# Patient Record
Sex: Female | Born: 1945 | Hispanic: No | Smoking: Former smoker
Health system: Southern US, Community
[De-identification: ages and names within clinical notes are randomized; demographics above are authoritative.]

## PROBLEM LIST (undated history)

## (undated) DIAGNOSIS — M1990 Unspecified osteoarthritis, unspecified site: Secondary | ICD-10-CM

## (undated) DIAGNOSIS — N819 Female genital prolapse, unspecified: Secondary | ICD-10-CM

## (undated) DIAGNOSIS — E7800 Pure hypercholesterolemia, unspecified: Secondary | ICD-10-CM

## (undated) DIAGNOSIS — R011 Cardiac murmur, unspecified: Secondary | ICD-10-CM

## (undated) HISTORY — DX: Pure hypercholesterolemia, unspecified: E78.00

## (undated) HISTORY — DX: Female genital prolapse, unspecified: N81.9

## (undated) HISTORY — PX: OTHER SURGICAL HISTORY: SHX169

---

## 1998-03-11 ENCOUNTER — Other Ambulatory Visit: Admission: RE | Admit: 1998-03-11 | Discharge: 1998-03-11 | Payer: Self-pay | Admitting: Obstetrics and Gynecology

## 2005-06-01 ENCOUNTER — Other Ambulatory Visit: Admission: RE | Admit: 2005-06-01 | Discharge: 2005-06-01 | Payer: Self-pay | Admitting: Family Medicine

## 2011-06-06 ENCOUNTER — Ambulatory Visit: Payer: Self-pay | Admitting: Gynecology

## 2011-06-16 ENCOUNTER — Ambulatory Visit: Payer: Self-pay | Admitting: Gynecology

## 2011-11-03 ENCOUNTER — Other Ambulatory Visit: Payer: Self-pay | Admitting: Obstetrics and Gynecology

## 2011-11-03 ENCOUNTER — Other Ambulatory Visit (HOSPITAL_COMMUNITY)
Admission: RE | Admit: 2011-11-03 | Discharge: 2011-11-03 | Disposition: A | Discharge status: Home / Self Care | Payer: Medicare Other | Source: Ambulatory Visit | Attending: Obstetrics and Gynecology | Admitting: Obstetrics and Gynecology

## 2011-11-03 DIAGNOSIS — Z124 Encounter for screening for malignant neoplasm of cervix: Secondary | ICD-10-CM | POA: Insufficient documentation

## 2011-11-08 ENCOUNTER — Ambulatory Visit: Payer: Medicare Other | Attending: Family Medicine | Admitting: Audiology

## 2011-11-08 DIAGNOSIS — H905 Unspecified sensorineural hearing loss: Secondary | ICD-10-CM | POA: Insufficient documentation

## 2011-12-21 ENCOUNTER — Other Ambulatory Visit: Payer: Self-pay | Admitting: Family Medicine

## 2011-12-21 DIAGNOSIS — G44009 Cluster headache syndrome, unspecified, not intractable: Secondary | ICD-10-CM

## 2011-12-21 DIAGNOSIS — H532 Diplopia: Secondary | ICD-10-CM

## 2011-12-21 DIAGNOSIS — R2689 Other abnormalities of gait and mobility: Secondary | ICD-10-CM

## 2011-12-25 ENCOUNTER — Other Ambulatory Visit: Payer: Medicare Other

## 2011-12-27 ENCOUNTER — Ambulatory Visit
Admission: RE | Admit: 2011-12-27 | Discharge: 2011-12-27 | Disposition: A | Discharge status: Home / Self Care | Payer: Medicare Other | Source: Ambulatory Visit | Attending: Family Medicine | Admitting: Family Medicine

## 2011-12-27 DIAGNOSIS — G44009 Cluster headache syndrome, unspecified, not intractable: Secondary | ICD-10-CM

## 2011-12-27 DIAGNOSIS — H532 Diplopia: Secondary | ICD-10-CM

## 2011-12-27 DIAGNOSIS — R2689 Other abnormalities of gait and mobility: Secondary | ICD-10-CM

## 2011-12-27 MED ORDER — GADOBENATE DIMEGLUMINE 529 MG/ML IV SOLN
15.0000 mL | Freq: Once | INTRAVENOUS | Status: AC | PRN
  Administered 2011-12-27: 15 mL via INTRAVENOUS

## 2012-01-25 ENCOUNTER — Other Ambulatory Visit: Payer: Self-pay | Admitting: Dermatology

## 2013-04-06 ENCOUNTER — Emergency Department (HOSPITAL_COMMUNITY)
Admission: EM | Admit: 2013-04-06 | Discharge: 2013-04-06 | Disposition: A | Discharge status: Home / Self Care | Payer: Medicare Other | Attending: Emergency Medicine | Admitting: Emergency Medicine

## 2013-04-06 ENCOUNTER — Emergency Department (HOSPITAL_COMMUNITY): Payer: Medicare Other

## 2013-04-06 ENCOUNTER — Encounter (HOSPITAL_COMMUNITY): Payer: Self-pay | Admitting: *Deleted

## 2013-04-06 DIAGNOSIS — M5430 Sciatica, unspecified side: Secondary | ICD-10-CM | POA: Insufficient documentation

## 2013-04-06 DIAGNOSIS — M549 Dorsalgia, unspecified: Secondary | ICD-10-CM

## 2013-04-06 DIAGNOSIS — Z91040 Latex allergy status: Secondary | ICD-10-CM | POA: Insufficient documentation

## 2013-04-06 DIAGNOSIS — M5432 Sciatica, left side: Secondary | ICD-10-CM

## 2013-04-06 MED ORDER — DIAZEPAM 2 MG PO TABS
2.0000 mg | ORAL_TABLET | Freq: Once | ORAL | Status: AC
  Administered 2013-04-06: 2 mg via ORAL
  Filled 2013-04-06: qty 1

## 2013-04-06 MED ORDER — CYCLOBENZAPRINE HCL 10 MG PO TABS: 10.0000 mg | ORAL_TABLET | Freq: Two times a day (BID) | ORAL | Status: DC | PRN

## 2013-04-06 MED ORDER — OXYCODONE-ACETAMINOPHEN 5-325 MG PO TABS
1.0000 | ORAL_TABLET | Freq: Once | ORAL | Status: AC
  Administered 2013-04-06: 1 via ORAL
  Filled 2013-04-06: qty 1

## 2013-04-06 MED ORDER — KETOROLAC TROMETHAMINE 30 MG/ML IJ SOLN
60.0000 mg | Freq: Once | INTRAMUSCULAR | Status: AC
  Administered 2013-04-06: 60 mg via INTRAVENOUS
  Filled 2013-04-06: qty 2

## 2013-04-06 MED ORDER — IBUPROFEN 600 MG PO TABS: 600.0000 mg | ORAL_TABLET | Freq: Four times a day (QID) | ORAL | Status: DC | PRN

## 2013-04-06 MED ORDER — OXYCODONE-ACETAMINOPHEN 5-325 MG PO TABS: 1.0000 | ORAL_TABLET | ORAL | Status: DC | PRN

## 2013-04-06 NOTE — ED Notes (Addendum)
Per ems pt c/o back pain x1 day. Started this mornig when she got up. Unable to walk now. Pain radiates down to left ankle. Pain 9/10 when standing. Denies physical trauma/injury.

## 2013-04-06 NOTE — ED Notes (Signed)
Pt states she is still in pain and does not feel like the medication she was given was effective. Pt unable to stand without pain.

## 2013-04-06 NOTE — ED Notes (Signed)
Pt alert and oriented x4. Respirations even and unlabored, bilateral symmetrical rise and fall of chest. Skin warm and dry. In no acute distress. Denies needs.   

## 2013-04-06 NOTE — ED Notes (Signed)
Patient transported to X-ray 

## 2013-04-06 NOTE — ED Notes (Signed)
BJY:[id number]<FA> Expected date:<BR> Expected time:<BR> Means of arrival:<BR> Comments:<BR> Wilczak in room

## 2013-04-06 NOTE — ED Provider Notes (Signed)
History     CSN: [id number]  Arrival date & time 04/06/13  1504   First MD Initiated Contact with Patient 04/06/13 1546      Chief Complaint  Patient presents with  . Back Pain    (Consider location/radiation/quality/duration/timing/severity/associated sxs/prior treatment) HPI  Patient is a 67 year old female presenting with low back pain that began this morning after she got out of bed and started walking. Patient states pain is constant sharp pain in the lumbar region with radiation down the left leg. States pain is sharp throughout the midthigh region in the lateral portion of from the knee down states some pins needles sensation. Patient states she is unable to walk due to pain not weakness. Denies any previous history of back injury or surgery. Denies any heavy lifting recently a claims couple weeks ago she tripped on a child's toy but did not fall and states she caught the toy with her left. Denies any fever, bladder or bowel incontinence, perianal numbness, history of cancer or active cancer, IV drug use.  History reviewed. No pertinent past medical history.  History reviewed. No pertinent past surgical history.  History reviewed. No pertinent family history.  History  Substance Use Topics  . Smoking status: Never Smoker   . Smokeless tobacco: Not on file  . Alcohol Use: No    OB History   Grav Para Term Preterm Abortions TAB SAB Ect Mult Living                  Review of Systems  Constitutional: Negative.   HENT: Negative.   Eyes: Negative.   Respiratory: Negative.   Cardiovascular: Negative.   Gastrointestinal: Negative.   Genitourinary: Negative.   Musculoskeletal: Positive for back pain and gait problem.       Gait problem d/t pain  Skin: Negative.   Neurological: Negative.     Allergies  Hydrocodone; Latex; and Sulfa antibiotics  Home Medications   Current Outpatient Rx  Name  Route  Sig  Dispense  Refill  . cetirizine (ZYRTEC) 10 MG tablet  Oral   Take 10 mg by mouth daily.         . cyclobenzaprine (FLEXERIL) 10 MG tablet   Oral   Take 1 tablet (10 mg total) by mouth 2 (two) times daily as needed for muscle spasms.   20 tablet   0   . ibuprofen (ADVIL,MOTRIN) 600 MG tablet   Oral   Take 1 tablet (600 mg total) by mouth every 6 (six) hours as needed for pain.   30 tablet   0   . Multiple Vitamins-Minerals (PRESERVISION AREDS 2 PO)   Oral   Take 1 tablet by mouth daily.         Marland Kitchen oxyCODONE-acetaminophen (PERCOCET/ROXICET) 5-325 MG per tablet   Oral   Take 1 tablet by mouth every 4 (four) hours as needed for pain.   15 tablet   0     BP 151/65  Pulse 74  Temp(Src) 98.7 F (37.1 C) (Oral)  Resp 23  SpO2 98%  Physical Exam  Constitutional: She is oriented to person, place, and time. She appears well-developed and well-nourished. No distress.  HENT:  Head: Normocephalic and atraumatic.  Eyes: Conjunctivae are normal.  Neck: Normal range of motion. Neck supple.  Cardiovascular: Normal rate, regular rhythm and normal heart sounds.   Pulses:      Posterior tibial pulses are 2+ on the right side, and 2+ on the left side.  Pulmonary/Chest:  Effort normal and breath sounds normal.  Abdominal: Soft. Bowel sounds are normal. There is no tenderness.  Musculoskeletal:  Mildly tender to palpation lumbar spine region  Neurological: She is alert and oriented to person, place, and time.  Skin: Skin is warm and dry. She is not diaphoretic.    ED Course  Procedures (including critical care time)  Patient able to ambulate in ED w/o assistance  Labs Reviewed - No data to display Dg Lumbar Spine Complete  04/06/2013  *RADIOLOGY REPORT*  Clinical Data: Back pain  LUMBAR SPINE - COMPLETE 4+ VIEW  Comparison: None.  Findings: Five views of the lumbar spine submitted.  There is disc space flattening with anterior spurring at L1-L2 level.  No acute fracture or subluxation.  IMPRESSION: No acute fracture or subluxation.   Degenerative changes at L1-L2 level.   Original Report Authenticated By: Natasha Mead, M.D.      1. Back pain   2. Sciatica, left       MDM  Patient with back pain.  No neurological deficits and normal neuro exam.  X-ray showed no acute fracture. Pain presentation suggestive of sciatica. No red flags for back pain. Patient can walk but states is painful.  No loss of bowel or bladder control.  No concern for cauda equina.  No fever, night sweats, weight loss, h/o cancer, IVDU.  RICE protocol and pain medicine indicated and discussed with patient. Patient d/w with Dr. Radford Pax, agrees with plan.Patient is stable at time of discharge           Jeannetta Ellis, PA-C 04/06/13 2040

## 2013-04-07 NOTE — ED Provider Notes (Signed)
Medical screening examination/treatment/procedure(s) were conducted as a shared visit with non-physician practitioner(s) and myself.  I personally evaluated the patient during the encounter   Nelia Shi, MD 04/07/13 [phone]

## 2014-01-22 ENCOUNTER — Ambulatory Visit: Payer: Self-pay | Admitting: Gynecology

## 2014-02-12 ENCOUNTER — Ambulatory Visit: Payer: Self-pay | Admitting: Gynecology

## 2014-03-31 ENCOUNTER — Other Ambulatory Visit: Payer: Self-pay | Admitting: *Deleted

## 2014-03-31 DIAGNOSIS — I83893 Varicose veins of bilateral lower extremities with other complications: Secondary | ICD-10-CM

## 2014-06-01 ENCOUNTER — Encounter: Payer: Self-pay | Admitting: Vascular Surgery

## 2014-06-02 ENCOUNTER — Encounter (HOSPITAL_COMMUNITY): Payer: Self-pay

## 2014-06-02 ENCOUNTER — Encounter: Payer: Self-pay | Admitting: Vascular Surgery

## 2014-06-12 IMAGING — CR DG LUMBAR SPINE COMPLETE 4+V
5 series · 5 of 5 positions shown · non-contrast
Comparison: None.

CLINICAL DATA: Back pain

LUMBAR SPINE - COMPLETE 4+ VIEW

[t lumbar spine ap]
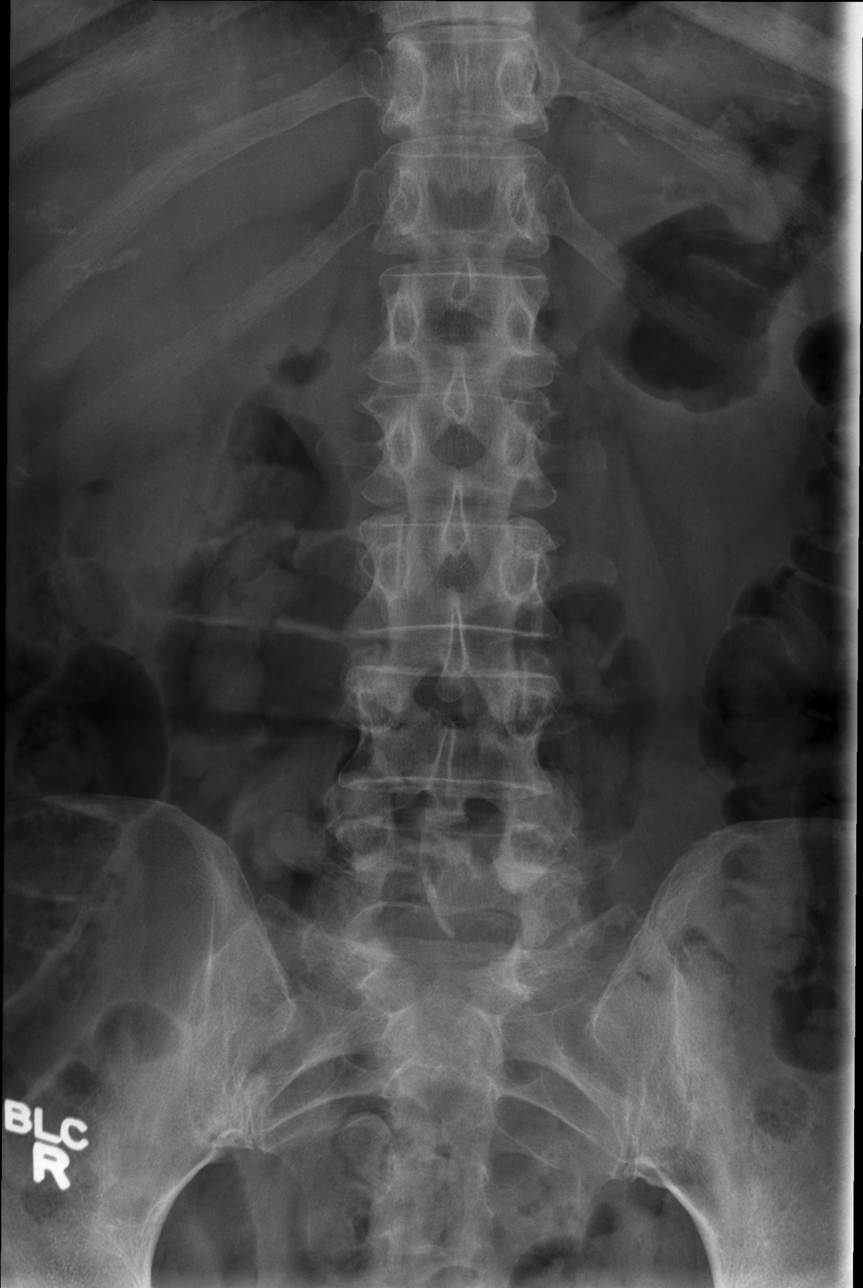

[t lumbar spine obl (1 of 2)]
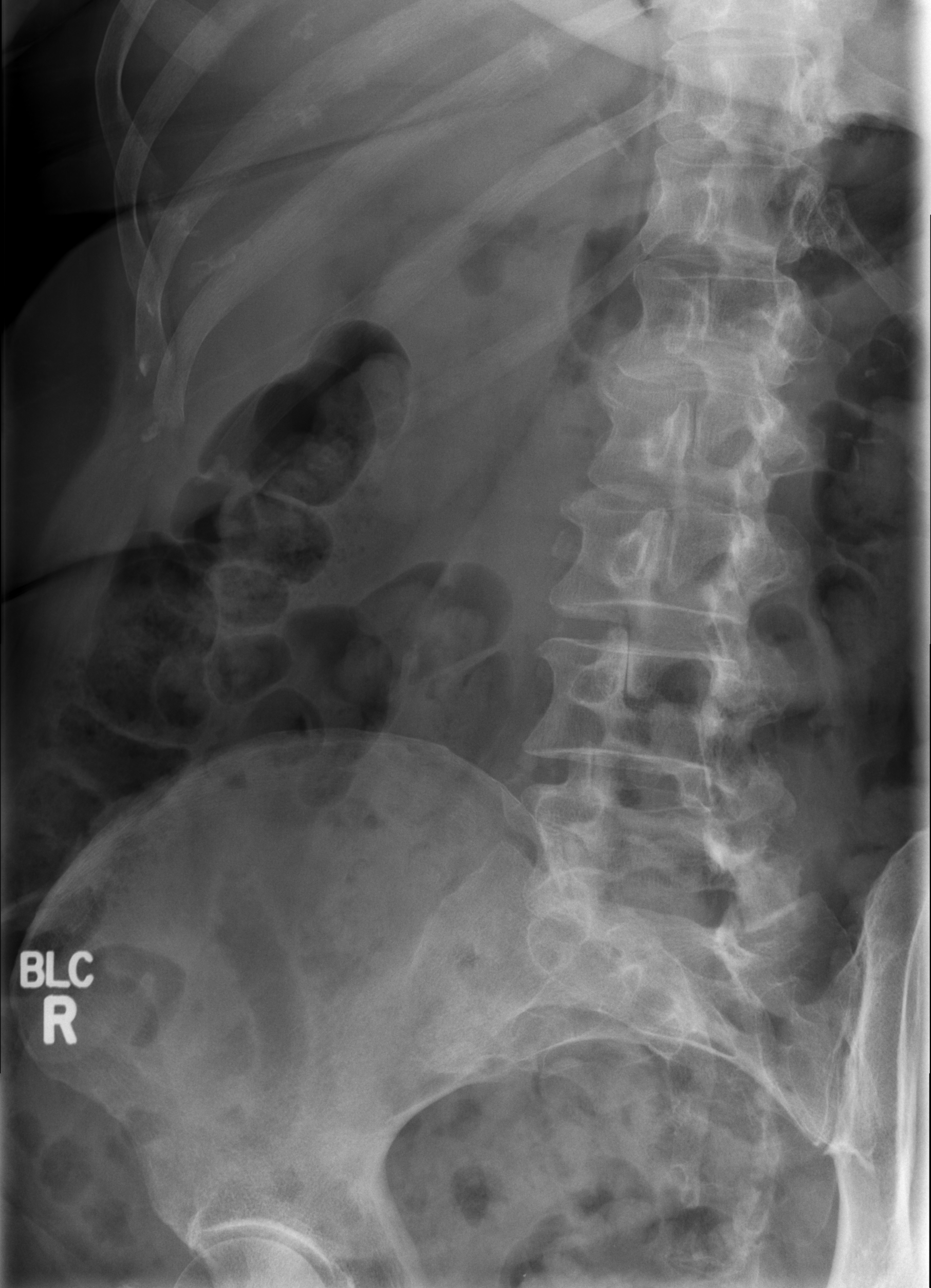

[t lumbar spine obl (2 of 2)]
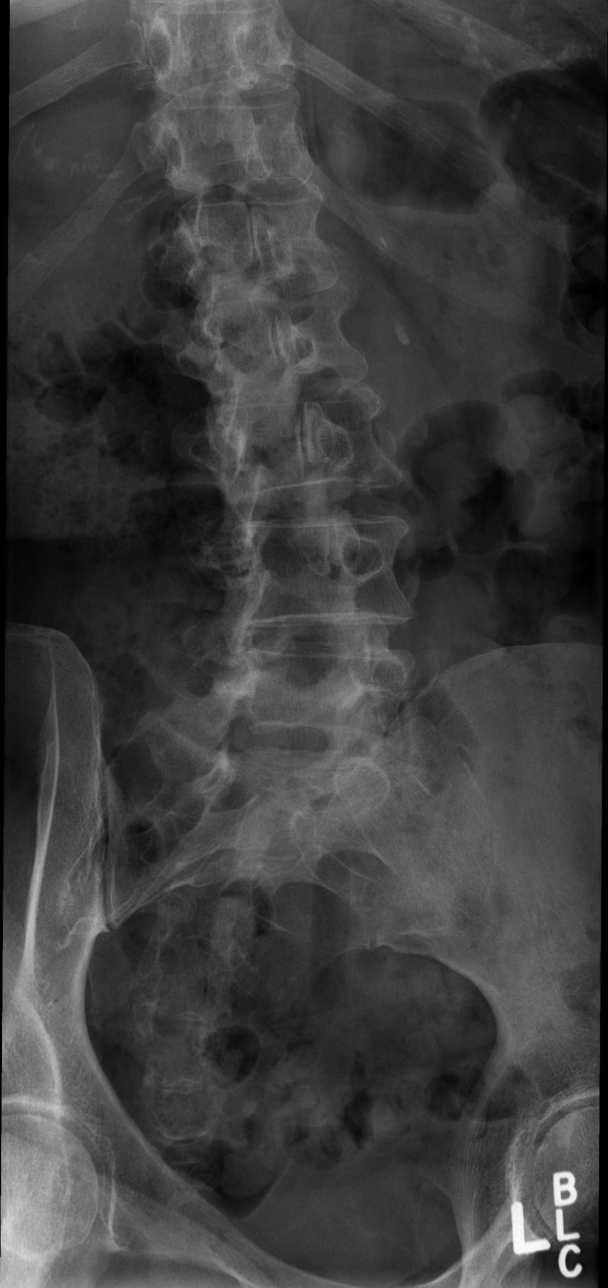

[t lumbar spine lat]
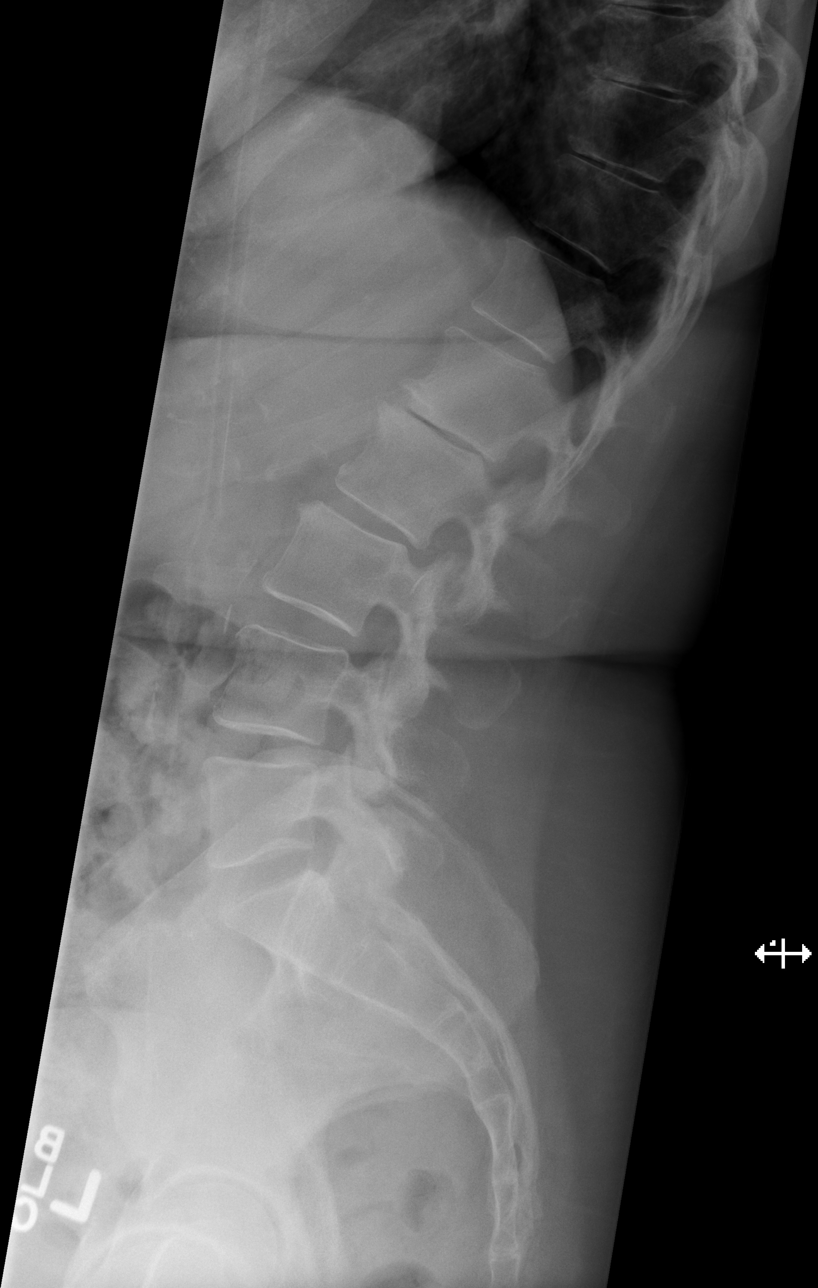

[t lumbar l-5 s-1 spot]
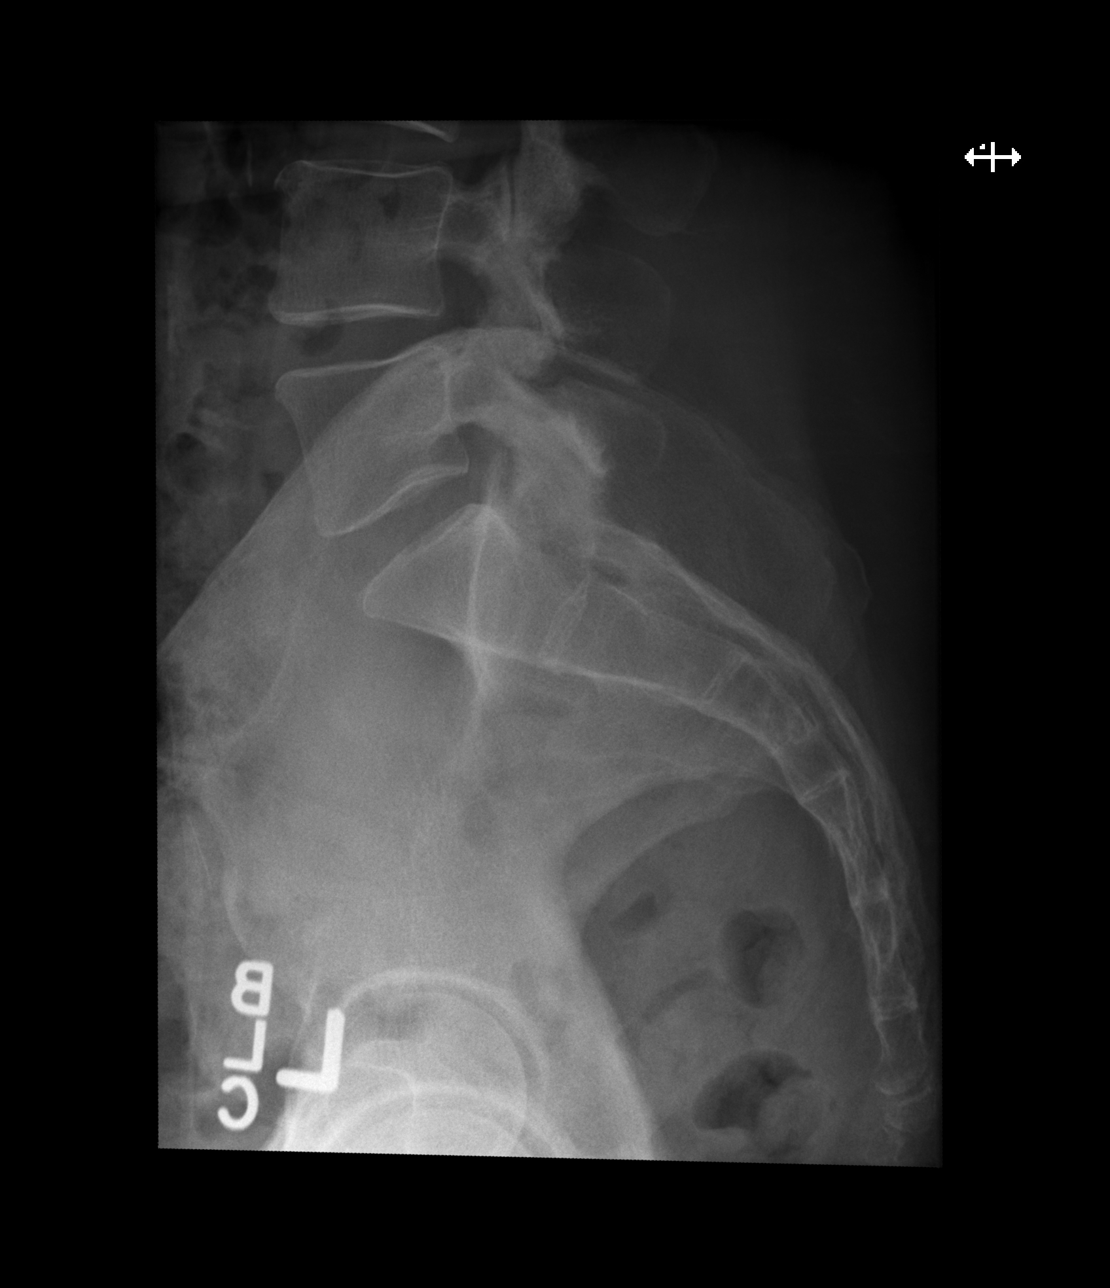

[5 of 5 positions shown; findings below may reference images not displayed]

FINDINGS: Five views of the lumbar spine submitted.  There is disc
space flattening with anterior spurring at L1-L2 level.  No acute
fracture or subluxation.
IMPRESSION: No acute fracture or subluxation.  Degenerative changes at L1-L2
level.

## 2014-07-09 ENCOUNTER — Encounter (HOSPITAL_COMMUNITY): Payer: Self-pay

## 2014-07-09 ENCOUNTER — Encounter: Payer: Self-pay | Admitting: Vascular Surgery

## 2014-07-29 ENCOUNTER — Encounter: Payer: Self-pay | Admitting: Vascular Surgery

## 2014-07-29 ENCOUNTER — Encounter (HOSPITAL_COMMUNITY): Payer: Self-pay

## 2014-09-17 ENCOUNTER — Encounter (HOSPITAL_COMMUNITY): Payer: Self-pay

## 2014-09-17 ENCOUNTER — Encounter: Payer: Self-pay | Admitting: Vascular Surgery

## 2014-11-02 ENCOUNTER — Other Ambulatory Visit: Payer: Self-pay | Admitting: Family Medicine

## 2014-11-02 ENCOUNTER — Other Ambulatory Visit (HOSPITAL_COMMUNITY)
Admission: RE | Admit: 2014-11-02 | Discharge: 2014-11-02 | Disposition: A | Discharge status: Home / Self Care | Payer: Medicare Other | Source: Ambulatory Visit | Attending: Family Medicine | Admitting: Family Medicine

## 2014-11-02 DIAGNOSIS — Z124 Encounter for screening for malignant neoplasm of cervix: Secondary | ICD-10-CM | POA: Insufficient documentation

## 2014-11-03 LAB — CYTOLOGY - PAP

## 2014-11-06 ENCOUNTER — Other Ambulatory Visit: Payer: Self-pay | Admitting: Family Medicine

## 2014-11-06 DIAGNOSIS — R011 Cardiac murmur, unspecified: Secondary | ICD-10-CM

## 2014-11-06 DIAGNOSIS — R0989 Other specified symptoms and signs involving the circulatory and respiratory systems: Secondary | ICD-10-CM

## 2014-11-12 ENCOUNTER — Other Ambulatory Visit: Payer: Self-pay

## 2014-11-30 ENCOUNTER — Other Ambulatory Visit: Payer: Self-pay

## 2015-05-11 ENCOUNTER — Other Ambulatory Visit: Payer: Self-pay | Admitting: Cardiology

## 2015-05-11 ENCOUNTER — Ambulatory Visit (HOSPITAL_COMMUNITY)
Admission: RE | Admit: 2015-05-11 | Discharge: 2015-05-11 | Disposition: A | Discharge status: Home / Self Care | Payer: Medicare Other | Source: Ambulatory Visit | Attending: Vascular Surgery | Admitting: Vascular Surgery

## 2015-05-11 DIAGNOSIS — R42 Dizziness and giddiness: Secondary | ICD-10-CM

## 2015-07-29 ENCOUNTER — Other Ambulatory Visit: Payer: Self-pay | Admitting: Obstetrics and Gynecology

## 2015-07-29 SURGERY — Surgical Case
Anesthesia: *Unknown

## 2015-07-30 ENCOUNTER — Encounter: Admission: AD | Payer: Self-pay | Source: Ambulatory Visit

## 2015-07-30 SURGERY — HYSTERECTOMY, VAGINAL
Anesthesia: General | Site: Vagina

## 2015-08-02 ENCOUNTER — Ambulatory Visit: Admission: AD | Admit: 2015-08-02 | Payer: Self-pay | Source: Ambulatory Visit | Admitting: Obstetrics and Gynecology

## 2015-08-04 ENCOUNTER — Other Ambulatory Visit: Payer: Self-pay | Admitting: Urology

## 2015-08-05 ENCOUNTER — Other Ambulatory Visit: Payer: Self-pay | Admitting: Urology

## 2015-08-06 ENCOUNTER — Other Ambulatory Visit: Payer: Self-pay | Admitting: Urology

## 2015-08-18 ENCOUNTER — Other Ambulatory Visit: Payer: Self-pay | Admitting: Obstetrics and Gynecology

## 2015-09-01 ENCOUNTER — Encounter (HOSPITAL_COMMUNITY)
Admission: RE | Admit: 2015-09-01 | Discharge: 2015-09-01 | Disposition: A | Discharge status: Home / Self Care | Payer: Medicare Other | Source: Ambulatory Visit | Attending: Obstetrics and Gynecology | Admitting: Obstetrics and Gynecology

## 2015-09-01 ENCOUNTER — Encounter (HOSPITAL_COMMUNITY): Payer: Self-pay

## 2015-09-01 ENCOUNTER — Other Ambulatory Visit: Payer: Self-pay

## 2015-09-01 DIAGNOSIS — M1990 Unspecified osteoarthritis, unspecified site: Secondary | ICD-10-CM | POA: Diagnosis not present

## 2015-09-01 DIAGNOSIS — Z882 Allergy status to sulfonamides status: Secondary | ICD-10-CM | POA: Diagnosis not present

## 2015-09-01 DIAGNOSIS — Z885 Allergy status to narcotic agent status: Secondary | ICD-10-CM | POA: Diagnosis not present

## 2015-09-01 DIAGNOSIS — N950 Postmenopausal bleeding: Secondary | ICD-10-CM | POA: Diagnosis present

## 2015-09-01 DIAGNOSIS — Z87891 Personal history of nicotine dependence: Secondary | ICD-10-CM | POA: Diagnosis not present

## 2015-09-01 DIAGNOSIS — Z91040 Latex allergy status: Secondary | ICD-10-CM | POA: Diagnosis not present

## 2015-09-01 DIAGNOSIS — R011 Cardiac murmur, unspecified: Secondary | ICD-10-CM | POA: Diagnosis not present

## 2015-09-01 DIAGNOSIS — F329 Major depressive disorder, single episode, unspecified: Secondary | ICD-10-CM | POA: Diagnosis not present

## 2015-09-01 DIAGNOSIS — N840 Polyp of corpus uteri: Secondary | ICD-10-CM | POA: Diagnosis not present

## 2015-09-01 HISTORY — DX: Cardiac murmur, unspecified: R01.1

## 2015-09-01 HISTORY — DX: Unspecified osteoarthritis, unspecified site: M19.90

## 2015-09-01 LAB — CBC
HEMATOCRIT: 40.5 % (ref 36.0–46.0)
Hemoglobin: 13.2 g/dL (ref 12.0–15.0)
MCH: 29.8 pg (ref 26.0–34.0)
MCHC: 32.6 g/dL (ref 30.0–36.0)
MCV: 91.4 fL (ref 78.0–100.0)
Platelets: 191 10*3/uL (ref 150–400)
RBC: 4.43 MIL/uL (ref 3.87–5.11)
RDW: 13.2 % (ref 11.5–15.5)
WBC: 6.2 10*3/uL (ref 4.0–10.5)

## 2015-09-01 NOTE — Patient Instructions (Addendum)
  Your procedure is scheduled on:  September 02, 2015  Enter through the Main Entrance of [facility] at:  9:45 am   Pick up the phone at the desk and dial [phone].  Call this number if you have problems the morning of surgery: [phone].  Remember: Do NOT eat food: after midnight tonight  Do NOT drink clear liquids after: midnight tonight  Take these medicines the morning of surgery with a SIP OF WATER:  None   Do NOT wear jewelry (body piercing), metal hair clips/bobby pins, make-up, or nail polish. Do NOT wear lotions, powders, or perfumes.  You may wear deoderant. Do NOT shave for 48 hours prior to surgery. Do NOT bring valuables to the hospital. Contacts, dentures, or bridgework may not be worn into surgery. Have a responsible adult drive you home and stay with you for 24 hours after your procedure

## 2015-09-02 ENCOUNTER — Ambulatory Visit (HOSPITAL_COMMUNITY): Payer: Medicare Other | Admitting: Certified Registered Nurse Anesthetist

## 2015-09-02 ENCOUNTER — Ambulatory Visit (HOSPITAL_COMMUNITY)
Admission: RE | Admit: 2015-09-02 | Discharge: 2015-09-02 | Disposition: A | Discharge status: Home / Self Care | Payer: Medicare Other | Source: Ambulatory Visit | Attending: Obstetrics and Gynecology | Admitting: Obstetrics and Gynecology

## 2015-09-02 ENCOUNTER — Encounter (HOSPITAL_COMMUNITY)
Admission: RE | Disposition: A | Discharge status: Home / Self Care | Payer: Self-pay | Source: Ambulatory Visit | Attending: Obstetrics and Gynecology

## 2015-09-02 ENCOUNTER — Encounter (HOSPITAL_COMMUNITY): Payer: Self-pay

## 2015-09-02 ENCOUNTER — Ambulatory Visit: Payer: Medicare Other | Admitting: Neurology

## 2015-09-02 DIAGNOSIS — N950 Postmenopausal bleeding: Secondary | ICD-10-CM | POA: Diagnosis not present

## 2015-09-02 DIAGNOSIS — Z87891 Personal history of nicotine dependence: Secondary | ICD-10-CM | POA: Insufficient documentation

## 2015-09-02 DIAGNOSIS — Z885 Allergy status to narcotic agent status: Secondary | ICD-10-CM | POA: Insufficient documentation

## 2015-09-02 DIAGNOSIS — Z882 Allergy status to sulfonamides status: Secondary | ICD-10-CM | POA: Insufficient documentation

## 2015-09-02 DIAGNOSIS — Z91040 Latex allergy status: Secondary | ICD-10-CM | POA: Insufficient documentation

## 2015-09-02 DIAGNOSIS — N840 Polyp of corpus uteri: Secondary | ICD-10-CM | POA: Diagnosis not present

## 2015-09-02 DIAGNOSIS — R011 Cardiac murmur, unspecified: Secondary | ICD-10-CM | POA: Diagnosis not present

## 2015-09-02 DIAGNOSIS — N9489 Other specified conditions associated with female genital organs and menstrual cycle: Secondary | ICD-10-CM

## 2015-09-02 DIAGNOSIS — F329 Major depressive disorder, single episode, unspecified: Secondary | ICD-10-CM | POA: Insufficient documentation

## 2015-09-02 DIAGNOSIS — M1990 Unspecified osteoarthritis, unspecified site: Secondary | ICD-10-CM | POA: Insufficient documentation

## 2015-09-02 HISTORY — PX: DILATATION & CURETTAGE/HYSTEROSCOPY WITH MYOSURE: SHX6511

## 2015-09-02 SURGERY — DILATATION & CURETTAGE/HYSTEROSCOPY WITH MYOSURE
Anesthesia: General | Site: Vagina

## 2015-09-02 MED ORDER — DEXAMETHASONE SODIUM PHOSPHATE 4 MG/ML IJ SOLN
INTRAMUSCULAR | Status: AC
  Filled 2015-09-02: qty 1

## 2015-09-02 MED ORDER — KETOROLAC TROMETHAMINE 30 MG/ML IJ SOLN
INTRAMUSCULAR | Status: AC
  Filled 2015-09-02: qty 1

## 2015-09-02 MED ORDER — ACETAMINOPHEN 325 MG PO TABS: 325.0000 mg | ORAL_TABLET | ORAL | Status: DC | PRN

## 2015-09-02 MED ORDER — OXYCODONE HCL 5 MG PO TABS: 5.0000 mg | ORAL_TABLET | Freq: Once | ORAL | Status: DC | PRN

## 2015-09-02 MED ORDER — FENTANYL CITRATE (PF) 100 MCG/2ML IJ SOLN
INTRAMUSCULAR | Status: AC
  Filled 2015-09-02: qty 4

## 2015-09-02 MED ORDER — ONDANSETRON HCL 4 MG/2ML IJ SOLN
INTRAMUSCULAR | Status: DC | PRN
  Administered 2015-09-02: 4 mg via INTRAVENOUS

## 2015-09-02 MED ORDER — SCOPOLAMINE 1 MG/3DAYS TD PT72
MEDICATED_PATCH | TRANSDERMAL | Status: AC
  Filled 2015-09-02: qty 1

## 2015-09-02 MED ORDER — FENTANYL CITRATE (PF) 100 MCG/2ML IJ SOLN
INTRAMUSCULAR | Status: DC | PRN
  Administered 2015-09-02: 50 ug via INTRAVENOUS

## 2015-09-02 MED ORDER — ACETAMINOPHEN 160 MG/5ML PO SOLN: 325.0000 mg | ORAL | Status: DC | PRN

## 2015-09-02 MED ORDER — MIDAZOLAM HCL 2 MG/2ML IJ SOLN
INTRAMUSCULAR | Status: AC
  Filled 2015-09-02: qty 4

## 2015-09-02 MED ORDER — OXYCODONE HCL 5 MG/5ML PO SOLN: 5.0000 mg | Freq: Once | ORAL | Status: DC | PRN

## 2015-09-02 MED ORDER — LACTATED RINGERS IV SOLN
INTRAVENOUS | Status: DC
  Administered 2015-09-02 (×2): via INTRAVENOUS

## 2015-09-02 MED ORDER — LIDOCAINE HCL (CARDIAC) 20 MG/ML IV SOLN
INTRAVENOUS | Status: AC
  Filled 2015-09-02: qty 5

## 2015-09-02 MED ORDER — PROPOFOL 10 MG/ML IV BOLUS
INTRAVENOUS | Status: AC
  Filled 2015-09-02: qty 20

## 2015-09-02 MED ORDER — LIDOCAINE HCL (CARDIAC) 20 MG/ML IV SOLN
INTRAVENOUS | Status: DC | PRN
  Administered 2015-09-02: 100 mg via INTRAVENOUS

## 2015-09-02 MED ORDER — FENTANYL CITRATE (PF) 100 MCG/2ML IJ SOLN: 25.0000 ug | INTRAMUSCULAR | Status: DC | PRN

## 2015-09-02 MED ORDER — MIDAZOLAM HCL 2 MG/2ML IJ SOLN
INTRAMUSCULAR | Status: DC | PRN
  Administered 2015-09-02: 1 mg via INTRAVENOUS

## 2015-09-02 MED ORDER — DEXAMETHASONE SODIUM PHOSPHATE 10 MG/ML IJ SOLN
INTRAMUSCULAR | Status: DC | PRN
  Administered 2015-09-02: 4 mg via INTRAVENOUS

## 2015-09-02 MED ORDER — ONDANSETRON HCL 4 MG/2ML IJ SOLN
INTRAMUSCULAR | Status: AC
  Filled 2015-09-02: qty 2

## 2015-09-02 MED ORDER — PROPOFOL 10 MG/ML IV BOLUS
INTRAVENOUS | Status: DC | PRN
  Administered 2015-09-02: 140 mg via INTRAVENOUS

## 2015-09-02 SURGICAL SUPPLY — 22 items
CANISTER SUCT 3000ML (MISCELLANEOUS) ×2 IMPLANT
CATH ROBINSON RED A/P 16FR (CATHETERS) ×2 IMPLANT
CLOTH BEACON ORANGE TIMEOUT ST (SAFETY) ×2 IMPLANT
CONTAINER PREFILL 10% NBF 60ML (FORM) ×3 IMPLANT
DEVICE MYOSURE CLASSIC (MISCELLANEOUS) ×1 IMPLANT
DEVICE MYOSURE LITE (MISCELLANEOUS) IMPLANT
ELECT REM PT RETURN 9FT ADLT (ELECTROSURGICAL) ×2
ELECTRODE REM PT RTRN 9FT ADLT (ELECTROSURGICAL) ×1 IMPLANT
FILTER ARTHROSCOPY CONVERTOR (FILTER) ×2 IMPLANT
GLOVE BIOGEL PI IND STRL 7.0 (GLOVE) ×2 IMPLANT
GLOVE BIOGEL PI INDICATOR 7.0 (GLOVE) ×2
GLOVE ECLIPSE 6.5 STRL STRAW (GLOVE) ×2 IMPLANT
GOWN STRL REUS W/TWL LRG LVL3 (GOWN DISPOSABLE) ×4 IMPLANT
MYOSURE XL FIBROID REM (MISCELLANEOUS)
PACK VAGINAL MINOR WOMEN LF (CUSTOM PROCEDURE TRAY) ×2 IMPLANT
PAD OB MATERNITY 4.3X12.25 (PERSONAL CARE ITEMS) ×2 IMPLANT
SEAL ROD LENS SCOPE MYOSURE (ABLATOR) ×2 IMPLANT
SYSTEM TISS REMOVAL MYSR XL RM (MISCELLANEOUS) IMPLANT
TOWEL OR 17X24 6PK STRL BLUE (TOWEL DISPOSABLE) ×4 IMPLANT
TUBING AQUILEX INFLOW (TUBING) ×2 IMPLANT
TUBING AQUILEX OUTFLOW (TUBING) ×2 IMPLANT
WATER STERILE IRR 1000ML POUR (IV SOLUTION) ×2 IMPLANT

## 2015-09-02 NOTE — Op Note (Signed)
Amanda Contreras, Amanda Contreras                ACCOUNT NO.:  [id number]  MEDICAL RECORD NO.:  [id number]  LOCATION:  [facility]                          FACILITY:  [facility]  PHYSICIAN:  Servando Salina, M.D.DATE OF BIRTH:  [birth year]  DATE OF PROCEDURE:  09/02/2015 DATE OF DISCHARGE:  09/02/2015                              OPERATIVE REPORT   PREOPERATIVE DIAGNOSIS:  Postmenopausal bleeding, complex endometrial mass.  PROCEDURE:  Diagnostic hysteroscopy, hysteroscopic resection of the endometrial mass using MyoSure, and dilatation and curettage.  POSTOPERATIVE DIAGNOSIS:  Postmenopausal bleeding, complex endometrial mass.  ANESTHESIA:  General.  SURGEON:  Servando Salina, M.D.  ASSISTANT:  None.  DESCRIPTION OF PROCEDURE:  Under adequate general anesthesia, the patient was placed in a dorsal lithotomy position.  She was sterilely prepped and draped in usual fashion.  Bladder was catheterized, small amount of urine.  Examination under anesthesia revealed an anteverted uterus, small.  No adnexal masses could be appreciated.  A bi-valve speculum placed in the vagina.  Single-tooth tenaculum was placed on the anterior lip of the cervix.  The cervix was then dilated up to a #19 Pratt dilator.  The MyoSure apparatus was inserted.  A large polypoid irregular lesion was noted, extended to the internal os and appears to be arising from the left lateral wall.  The classic MyoSure apparatus was then used to resect that polyp in its entirety.  The right tubal ostia could be seen.  The left ostia was not seen.  The uterine cavity showed an atrophic lining.  The MyoSure apparatus was then removed after the mass had been removed.  Cavity was then gently curetted for scant amount of tissue.  All instruments were then removed from the vagina.  SPECIMEN:  Labeled endometrial curetting and endometrial mass was sent to Pathology.  ESTIMATED BLOOD LOSS:  Minimal.  COMPLICATIONS:  None.  FLUID DEFICIT:   275 mL.  The patient tolerated the procedure well and was transferred to recovery room in a stable condition.     Servando Salina, M.D.     [facility]/MED[facility]  D:  09/02/2015  T:  09/02/2015  Job:  [id number]

## 2015-09-02 NOTE — Discharge Instructions (Signed)
CALL  IF TEMP>100.4, NOTHING PER VAGINA X 1 WK, CALL IF SOAKING A MAXI  PAD EVERY HOUR OR MORE FREQUENTLY 

## 2015-09-02 NOTE — Anesthesia Procedure Notes (Signed)
Procedure Name: LMA Insertion Date/Time: 09/02/2015 11:33 AM Performed by: Hewitt Blade Pre-anesthesia Checklist: Patient identified, Emergency Drugs available, Suction available and Patient being monitored Patient Re-evaluated:Patient Re-evaluated prior to inductionOxygen Delivery Method: Circle system utilized Preoxygenation: Pre-oxygenation with 100% oxygen Intubation Type: IV induction Ventilation: Mask ventilation without difficulty LMA: LMA inserted LMA Size: 4.0 Number of attempts: 1 Placement Confirmation: positive ETCO2 and breath sounds checked- equal and bilateral Tube secured with: Tape Dental Injury: Teeth and Oropharynx as per pre-operative assessment

## 2015-09-02 NOTE — Anesthesia Postprocedure Evaluation (Signed)
  Anesthesia Post-op Note  Patient: Amanda Contreras  Procedure(s) Performed: Procedure(s): DILATATION & CURETTAGE/HYSTEROSCOPY WITH MYOSURE (N/A)  Patient Location: PACU  Anesthesia Type:General  Level of Consciousness: awake  Airway and Oxygen Therapy: Patient Spontanous Breathing  Post-op Pain: mild  Post-op Assessment: Post-op Vital signs reviewed, Patient's Cardiovascular Status Stable, Respiratory Function Stable, Patent Airway, No signs of Nausea or vomiting and Pain level controlled              Post-op Vital Signs: Reviewed and stable  Last Vitals:  Filed Vitals:   09/02/15 1315  BP: 121/58  Pulse: 50  Temp: 36.4 C  Resp: 17    Complications: No apparent anesthesia complications

## 2015-09-02 NOTE — Brief Op Note (Signed)
09/02/2015  12:26 PM  PATIENT:  Amanda Contreras  69 y.o. female  PRE-OPERATIVE DIAGNOSIS:  Postmenopausal Bleeding, Complex Endometrial Mass  POST-OPERATIVE DIAGNOSIS:  Postmenopausal Bleeding, Complex Endometrial Mass  PROCEDURE: diagnostic hysteroscopy, hysteroscopic resection of endometrial mass with myosure, dilation and curettage  SURGEON:  Surgeon(s) and Role:    * Servando Salina, MD - Primary  PHYSICIAN ASSISTANT:   ASSISTANTS: none   ANESTHESIA:   general Findings: large irreg soft tissue mass arising from left lateral/posterior wall and extend to internal os, right tubal ostia seen. Left sclerosed. nl endocervical canal EBL:  Total I/O In: 1200 [I.V.:1200] Out: 50 [Urine:50]  BLOOD ADMINISTERED:none  DRAINS: none   LOCAL MEDICATIONS USED:  NONE  SPECIMEN:  Source of Specimen:  EMC, endom mass  DISPOSITION OF SPECIMEN:  PATHOLOGY  COUNTS:  YES  TOURNIQUET:  * No tourniquets in log *  DICTATION: .Other Dictation: Dictation Number [id number]  PLAN OF CARE: Discharge to home after PACU  PATIENT DISPOSITION:  PACU - hemodynamically stable.   Delay start of Pharmacological VTE agent (>24hrs) due to surgical blood loss or risk of bleeding: no

## 2015-09-02 NOTE — Anesthesia Preprocedure Evaluation (Signed)
Anesthesia Evaluation  Patient identified by MRN, date of birth, ID band Patient awake    Reviewed: Allergy & Precautions, NPO status , Patient's Chart, lab work & pertinent test results  History of Anesthesia Complications Negative for: history of anesthetic complications  Airway Mallampati: II  TM Distance: >3 FB Neck ROM: Full    Dental  (+) Teeth Intact, Implants   Pulmonary neg shortness of breath, neg sleep apnea, neg COPD, neg recent URI, former smoker,    breath sounds clear to auscultation       Cardiovascular negative cardio ROS   Rhythm:Regular     Neuro/Psych PSYCHIATRIC DISORDERS Depression negative neurological ROS     GI/Hepatic negative GI ROS, Neg liver ROS,   Endo/Other  negative endocrine ROS  Renal/GU negative Renal ROS     Musculoskeletal  (+) Arthritis ,   Abdominal   Peds  Hematology negative hematology ROS (+)   Anesthesia Other Findings   Reproductive/Obstetrics                             Anesthesia Physical Anesthesia Plan  ASA: II  Anesthesia Plan: General   Post-op Pain Management:    Induction: Intravenous  Airway Management Planned: LMA  Additional Equipment: None  Intra-op Plan:   Post-operative Plan: Extubation in OR  Informed Consent: I have reviewed the patients History and Physical, chart, labs and discussed the procedure including the risks, benefits and alternatives for the proposed anesthesia with the patient or authorized representative who has indicated his/her understanding and acceptance.   Dental advisory given  Plan Discussed with: CRNA and Surgeon  Anesthesia Plan Comments:         Anesthesia Quick Evaluation

## 2015-09-02 NOTE — H&P (Signed)
Amanda Contreras is an 69 y.o. female G19P1 widowed female presents for diagnostic hysteroscopy, D&C, resection of endometrial mass found on sonogram done for  evaluation of PMB  Pertinent Gynecological History: Menses: post-menopausal Bleeding: post menopausal bleeding Contraception: none DES exposure: denies Blood transfusions: none Sexually transmitted diseases: no past history Previous GYN Procedures: none  Last mammogram: normal Date: 2016 Last pap: normal Date: 2016 OB History: G1,P1   Menstrual History: Menarche age: n/a No LMP recorded.    Past Medical History  Diagnosis Date  . Heart murmur   . Depression     Patient is having some depression currently   . Arthritis     No past surgical history on file.  FH: noncontributory  Social History:  reports that she has quit smoking. Her smoking use included Cigarettes. She has never used smokeless tobacco. She reports that she does not drink alcohol or use illicit drugs.  Allergies:  Allergies  Allergen Reactions  . Hydrocodone Other (See Comments)    dizziness  . Latex Rash  . Sulfa Antibiotics Rash    No prescriptions prior to admission    Review of Systems  All other systems reviewed and are negative.   There were no vitals taken for this visit. Physical Exam  Constitutional: She is oriented to person, place, and time. She appears well-developed and well-nourished.  HENT:  Head: Atraumatic.  Eyes: EOM are normal.  Neck: Neck supple.  Cardiovascular: Normal rate.   Respiratory: Breath sounds normal.  GI: Soft.  Genitourinary: Vagina normal and uterus normal.  Uterovaginal prolapse  Musculoskeletal: Normal range of motion.  Neurological: She is alert and oriented to person, place, and time.  Skin: Skin is warm and dry.  Psychiatric: She has a normal mood and affect.  vulva no lesion  Results for orders placed or performed during the hospital encounter of 09/01/15 (from the past 24 hour(s))  CBC      Status: None   Collection Time: 09/01/15 11:45 AM  Result Value Ref Range   WBC 6.2 4.0 - 10.5 K/uL   RBC 4.43 3.87 - 5.11 MIL/uL   Hemoglobin 13.2 12.0 - 15.0 g/dL   HCT 40.5 36.0 - 46.0 %   MCV 91.4 78.0 - 100.0 fL   MCH 29.8 26.0 - 34.0 pg   MCHC 32.6 30.0 - 36.0 g/dL   RDW 13.2 11.5 - 15.5 %   Platelets 191 150 - 400 K/uL    No results found.  Assessment/Plan: PMB Endometrial mass P) dx hysteroscopy, D&C, resection of endometrial mass. Risk of surgery including infection, bleeding, uterine perforation and its mgmt, thermal injury, fluid overload and its consequence.   COUSINS,SHERONETTE A 09/02/2015, 7:50 AM

## 2015-09-02 NOTE — Transfer of Care (Signed)
Immediate Anesthesia Transfer of Care Note  Patient: Amanda Contreras  Procedure(s) Performed: Procedure(s): DILATATION & CURETTAGE/HYSTEROSCOPY WITH MYOSURE (N/A)  Patient Location: PACU  Anesthesia Type:General  Level of Consciousness: awake, alert , oriented and patient cooperative  Airway & Oxygen Therapy: Patient Spontanous Breathing and Patient connected to nasal cannula oxygen  Post-op Assessment: Report given to RN and Post -op Vital signs reviewed and stable  Post vital signs: Reviewed and stable  Last Vitals:  Filed Vitals:   09/02/15 0934  BP: 104/56  Pulse: 59  Temp: 36.4 C  Resp: 18    Complications: No apparent anesthesia complications

## 2015-09-04 ENCOUNTER — Encounter (HOSPITAL_COMMUNITY): Payer: Self-pay | Admitting: Obstetrics and Gynecology

## 2015-09-20 ENCOUNTER — Ambulatory Visit: Payer: Medicare Other | Admitting: Neurology

## 2015-09-23 ENCOUNTER — Ambulatory Visit (INDEPENDENT_AMBULATORY_CARE_PROVIDER_SITE_OTHER): Payer: Medicare Other | Admitting: Neurology

## 2015-09-23 ENCOUNTER — Encounter: Payer: Self-pay | Admitting: Neurology

## 2015-09-23 VITALS — BP 114/69 | HR 62 | Ht 62.0 in | Wt 132.0 lb

## 2015-09-23 DIAGNOSIS — F0390 Unspecified dementia without behavioral disturbance: Secondary | ICD-10-CM

## 2015-09-23 MED ORDER — MEMANTINE HCL 10 MG PO TABS: 10.0000 mg | ORAL_TABLET | Freq: Two times a day (BID) | ORAL | Status: DC

## 2015-09-23 NOTE — Progress Notes (Signed)
PATIENT: Amanda Contreras DOB: [birth year]  Chief Complaint  Patient presents with  . Memory Loss    MMSE - animals.  She is here with her cousin, [name].  She has been having memory difficulty for the last 3-4 years.  She feels her life changes have played a role.  She lost her husband in 2012 and her mother 42.  She has previously had an abnormal MRI.     HISTORICAL  Amanda Contreras is a 69 years old right-handed female, seen in refer by  primary care physician [name], MD in September 23 2015 for evaluation of memory loss, she is accompanied by her cousin [name] at today's clinical visit.  She has past medical history of sensory neuronopathy hearing loss since 2012, hyperlipidemia, previous smoker, quit in 1989,  She had masses degree, majored in [place] education, was a retired Pharmacist, hospital at age 32, she was widowed since 2012, also took care of her mother afterwards, who passed away in Spring of 2015.  I saw her previously for double vision in 2013, which has resolved, I personally reviewed MRI of the brain January 2013, mild small vessel disease, generalized atrophy.  Since spring of 2015, she was noted to have word finding difficulties, sometimes driving familiar route, she has to think hard to get to the destination, once she was lost in her urologist office. Still managing her own bill, lives with her dog at her household, forgetting appointments sometimes, she also complains of difficulty falling to sleep, not eating regularly, she is also on diet, lost over 30 pounds since 2015.  Her paternal grandmother suffered dementia.  REVIEW OF SYSTEMS: Full 14 system review of systems performed and notable only for swelling in legs, incontinence, joint pain, allergy, runny nose, memory loss, confusion, dizziness, insomnia, snoring, depression, anxiety.  ALLERGIES: Allergies  Allergen Reactions  . Hydrocodone Other (See Comments)    dizziness  . Latex Rash  . Sulfa Antibiotics  Rash    HOME MEDICATIONS: Current Outpatient Prescriptions  Medication Sig Dispense Refill  . Calcium-Phosphorus-Vitamin D (CITRACAL +D3 PO) Take 2 tablets by mouth 2 (two) times daily.    . Cholecalciferol (VITAMIN D PO) Take by mouth daily.    . Multiple Vitamins-Minerals (PRESERVISION AREDS 2 PO) Take 1 tablet by mouth 2 (two) times daily.      No current facility-administered medications for this visit.    PAST MEDICAL HISTORY: Past Medical History  Diagnosis Date  . Heart murmur   . Depression     Patient is having some depression currently   . Arthritis   . Anxiety   . Genital prolapse   . Postmenopausal   . Hypercholesteremia     PAST SURGICAL HISTORY: Past Surgical History  Procedure Laterality Date  . Dilatation & curettage/hysteroscopy with myosure N/A 09/02/2015    Procedure: [place];  Surgeon: Servando Salina, MD;  Location: [facility];  Service: Gynecology;  Laterality: N/A;  . Uterine/bladder prolapse surgery      Scheduled 10/19/15    FAMILY HISTORY: Family History  Problem Relation Age of Onset  . Parkinson's disease Father   . Pancreatitis Maternal Grandmother   . Diabetes Maternal Grandfather   . Heart attack Maternal Grandfather   . Stomach cancer Paternal Grandfather   . Dementia Paternal Grandmother     SOCIAL HISTORY:  Social History   Social History  . Marital Status: Widowed    Spouse Name: N/A  . Number of  Children: 1  . Years of Education: Masters   Occupational History  . Retired    Social History Main Topics  . Smoking status: Former Smoker    Types: Cigarettes  . Smokeless tobacco: Never Used     Comment: Quit 1988  . Alcohol Use: 0.0 oz/week    0 Standard drinks or equivalent per week     Comment: Social  . Drug Use: No  . Sexual Activity: Not on file   Other Topics Concern  . Not on file   Social History Narrative   Lives at home alone.   Right-handed.   2-4 cups per week.      PHYSICAL EXAM   Filed Vitals:   09/23/15 1342  BP: 114/69  Pulse: 62  Height: 5\' 2"  (1.575 m)  Weight: 132 lb (59.875 kg)    Not recorded      Body mass index is 24.14 kg/(m^2).  PHYSICAL EXAMNIATION:  Gen: NAD, conversant, well nourised, obese, well groomed                     Cardiovascular: Regular rate rhythm, no peripheral edema, warm, nontender. Eyes: Conjunctivae clear without exudates or hemorrhage Neck: Supple, no carotid bruise. Pulmonary: Clear to auscultation bilaterally   NEUROLOGICAL EXAM:  MENTAL STATUS: Speech:    Speech is normal; fluent and spontaneous with normal comprehension.  Cognition: Mini-Mental Status Examination 29 out of 30, animal naming is 20     Orientation to time, place and person     Normal recent and remote memory     Normal Attention span and concentration     Normal Language, naming, repeating,spontaneous speech     Fund of knowledge   CRANIAL NERVES: CN II: Visual fields are full to confrontation. Pupils are round equal and briskly reactive to light. CN III, IV, VI: extraocular movement are normal. No ptosis. CN V: Facial sensation is intact to pinprick in all 3 divisions bilaterally. Corneal responses are intact.  CN VII: Face is symmetric with normal eye closure and smile. CN VIII: Hearing is normal to rubbing fingers CN IX, X: Palate elevates symmetrically. Phonation is normal. CN XI: Head turning and shoulder shrug are intact CN XII: Tongue is midline with normal movements and no atrophy.  MOTOR: There is no pronator drift of out-stretched arms. Muscle bulk and tone are normal. Muscle strength is normal.  REFLEXES: Reflexes are 2+ and symmetric at the biceps, triceps, knees, and ankles. Plantar responses are flexor.  SENSORY: Intact to light touch, pinprick, position sense, and vibration sense are intact in fingers and toes.  COORDINATION: Rapid alternating movements and fine finger movements are intact. There is  no dysmetria on finger-to-nose and heel-knee-shin.    GAIT/STANCE: Posture is normal. Gait is steady with normal steps, base, arm swing, and turning. Heel and toe walking are normal. Tandem gait is normal.  Romberg is absent.   DIAGNOSTIC DATA (LABS, IMAGING, TESTING) - I reviewed patient records, labs, notes, testing and imaging myself where available.   ASSESSMENT AND PLAN  Amanda Contreras is a 69 y.o. 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 Ph.D.  [facility] [address], [place], [place] [address] Ph: [phone] Fax: [phone]  CC: Leighton Ruff, MD

## 2015-10-04 ENCOUNTER — Other Ambulatory Visit: Payer: Self-pay | Admitting: Obstetrics and Gynecology

## 2015-10-11 NOTE — Patient Instructions (Addendum)
YOUR PROCEDURE IS SCHEDULED ON : 10/19/15  REPORT TO [facility] MAIN ENTRANCE FOLLOW SIGNS TO EAST ELEVATOR - GO TO 3rd FLOOR CHECK IN AT 3 EAST NURSES STATION (SHORT STAY) AT: 5:30 AM  CALL THIS NUMBER IF YOU HAVE PROBLEMS THE MORNING OF SURGERY [phone]  REMEMBER:ONLY 1 PER PERSON MAY GO TO SHORT STAY WITH YOU TO GET READY THE MORNING OF YOUR SURGERY  DO NOT EAT FOOD OR DRINK LIQUIDS AFTER MIDNIGHT  TAKE THESE MEDICINES THE MORNING OF SURGERY: MEMANTINE  YOU MAY NOT HAVE ANY METAL ON YOUR BODY INCLUDING HAIR PINS AND PIERCING'S. DO NOT WEAR JEWELRY, MAKEUP, LOTIONS, POWDERS OR PERFUMES. DO NOT WEAR NAIL POLISH. DO NOT SHAVE 48 HRS PRIOR TO SURGERY. MEN MAY SHAVE FACE AND NECK.  DO NOT [place]. [place] IS NOT RESPONSIBLE FOR VALUABLES.  CONTACTS, DENTURES OR PARTIALS MAY NOT BE WORN TO SURGERY. LEAVE SUITCASE IN CAR. CAN BE BROUGHT TO ROOM AFTER SURGERY.  PATIENTS DISCHARGED THE DAY OF SURGERY WILL NOT BE ALLOWED TO DRIVE HOME.  PLEASE READ OVER THE FOLLOWING INSTRUCTION SHEETS _________________________________________________________________________________                                          [facility] - PREPARING FOR SURGERY  Before surgery, you can play an important role.  Because skin is not sterile, your skin needs to be as free of germs as possible.  You can reduce the number of germs on your skin by washing with CHG (chlorahexidine gluconate) soap before surgery.  CHG is an antiseptic cleaner which kills germs and bonds with the skin to continue killing germs even after washing. Please DO NOT use if you have an allergy to CHG or antibacterial soaps.  If your skin becomes reddened/irritated stop using the CHG and inform your nurse when you arrive at Short Stay. Do not shave (including legs and underarms) for at least 48 hours prior to the first CHG shower.  You may shave your face. Please follow these instructions  carefully:   1.  Shower with CHG Soap the night before surgery and the  morning of Surgery.   2.  If you choose to wash your hair, wash your hair first as usual with your  normal  Shampoo.   3.  After you shampoo, rinse your hair and body thoroughly to remove the  shampoo.                                         4.  Use CHG as you would any other liquid soap.  You can apply chg directly  to the skin and wash . Gently wash with scrungie or clean wascloth    5.  Apply the CHG Soap to your body ONLY FROM THE NECK DOWN.   Do not use on open                           Wound or open sores. Avoid contact with eyes, ears mouth and genitals (private parts).                        Genitals (private parts) with your normal soap.  6.  Wash thoroughly, paying special attention to the area where your surgery  will be performed.   7.  Thoroughly rinse your body with warm water from the neck down.   8.  DO NOT shower/wash with your normal soap after using and rinsing off  the CHG Soap .                9.  Pat yourself dry with a clean towel.             10.  Wear clean night clothes to bed after shower             11.  Place clean sheets on your bed the night of your first shower and do not  sleep with pets.  Day of Surgery : Do not apply any lotions/deodorants the morning of surgery.  Please wear clean clothes to the hospital/surgery center.  FAILURE TO FOLLOW THESE INSTRUCTIONS MAY RESULT IN THE CANCELLATION OF YOUR SURGERY    PATIENT SIGNATURE_________________________________  ______________________________________________________________________

## 2015-10-12 ENCOUNTER — Encounter (HOSPITAL_COMMUNITY): Payer: Self-pay

## 2015-10-12 ENCOUNTER — Encounter (HOSPITAL_COMMUNITY)
Admission: RE | Admit: 2015-10-12 | Discharge: 2015-10-12 | Disposition: A | Discharge status: Home / Self Care | Payer: Medicare Other | Source: Ambulatory Visit | Attending: Urology | Admitting: Urology

## 2015-10-12 DIAGNOSIS — Z01818 Encounter for other preprocedural examination: Secondary | ICD-10-CM | POA: Insufficient documentation

## 2015-10-12 DIAGNOSIS — N8110 Cystocele, unspecified: Secondary | ICD-10-CM | POA: Insufficient documentation

## 2015-10-12 DIAGNOSIS — N816 Rectocele: Secondary | ICD-10-CM | POA: Diagnosis not present

## 2015-10-12 LAB — BASIC METABOLIC PANEL
ANION GAP: 5 (ref 5–15)
BUN: 24 mg/dL — ABNORMAL HIGH (ref 6–20)
CHLORIDE: 108 mmol/L (ref 101–111)
CO2: 29 mmol/L (ref 22–32)
Calcium: 8.7 mg/dL — ABNORMAL LOW (ref 8.9–10.3)
Creatinine, Ser: 0.62 mg/dL (ref 0.44–1.00)
GFR calc non Af Amer: >60 mL/min (ref >60)
Glucose, Bld: 80 mg/dL (ref 65–99)
POTASSIUM: 3.9 mmol/L (ref 3.5–5.1)
SODIUM: 142 mmol/L (ref 135–145)

## 2015-10-12 LAB — CBC
HCT: 42.1 % (ref 36.0–46.0)
HEMOGLOBIN: 13.5 g/dL (ref 12.0–15.0)
MCH: 29.7 pg (ref 26.0–34.0)
MCHC: 32.1 g/dL (ref 30.0–36.0)
MCV: 92.7 fL (ref 78.0–100.0)
PLATELETS: 168 10*3/uL (ref 150–400)
RBC: 4.54 MIL/uL (ref 3.87–5.11)
RDW: 12.7 % (ref 11.5–15.5)
WBC: 6.2 10*3/uL (ref 4.0–10.5)

## 2015-10-12 LAB — ABO/RH: ABO/RH(D): B POS

## 2015-10-14 ENCOUNTER — Inpatient Hospital Stay (HOSPITAL_COMMUNITY): Admission: RE | Admit: 2015-10-14 | Payer: Self-pay | Source: Ambulatory Visit

## 2015-10-18 ENCOUNTER — Other Ambulatory Visit: Payer: Self-pay | Admitting: Urology

## 2015-10-18 NOTE — H&P (Signed)
History of Present Illness   I was consulted by Dr Diona Fanti regarding Ms Goodrich pelvic organ prolapse worsened over a few years. She can feel vaginal bulging and she reduces it. She has no splinting maneuvers. She has not had a hysterectomy. Her bowel function is normal. She has no neurologic risk factors or symptoms.   Her incontinence is getting a little bit worse. In the morning she can have foot-on-the-floor syndrome, but she has little to rare urge incontinence during the day. She can leak with coughing and bending. She denies enuresis and wears 1 pad a day moderately wet.    When I saw Ms Kercheval in July 2016 her uterus descended to the introitus, and she had a mild grade 2 cystocele with a central defect. She had grade 1 hypermobility of the bladder neck but a mild positive cough test after I reduced the prolapse. She had mild diffuse grade 2 rectocele with some shortening of the anterior vaginal wall.  I thought if she ever had surgery she would likely best benefit from a transvaginal hysterectomy with vault suspension, cystocele repair and graft, and possible rectocele repair. She is here to discuss her urodynamics.   On urodynamics Ms Kreft did not void and was catheterized for 50 mL. She had a 525 mL bladder that was stable. At 400 mL she leaked a mild amount with Valsalva leak-point pressure 4 cmH2O. This is with the prolapse reduced. There was no leaking prior to reduction. During voluntary voiding she voided 400 mL with a max flow of 8 mL/second. Maximum voiding pressure was 13 cmH2O. Residual was 100 mL. She had a prolonged voiding pattern with an interrupted flow pattern. EMG activity increased during the voiding phase. Contraction was not that well sustained. Bladder neck descended 3 cm. Fluoroscopically, she had a cystocele noted. Details of the urodynamics are signed and dictated on the urodynamics sheet.    Past Medical History Problems  1. History of bleeding disorder  (Z86.2) 2. History of cardiac murmur (Z86.79) 3. History of hypercholesterolemia (Z86.39)  Surgical History Problems  1. History of No Surgical Problems  Current Meds 1. Baby Aspirin 81 MG CHEW;  Therapy: (Recorded:27May2016) to Recorded 2. Citracal + D TABS;  Therapy: (Recorded:27May2016) to Recorded 3. PreserVision AREDS CAPS;  Therapy: (Recorded:27May2016) to Recorded  Allergies Medication  1. Sulfa Drugs 2. Hydrocodone-Acetaminophen TABS  Family History Problems  1. Family history of diabetes mellitus (Z83.3) : Maternal Grandfather 2. Family history of malignant neoplasm of stomach (Z80.0) : Paternal Grandfather 3. Family history of pancreatic cancer (Z80.0) : Maternal Grandmother 4. Family history of stroke (Z82.3) : Mother 5. Family history of Hardening of the arteries of the heart : Paternal Grandmother  Social History Problems  1. Alcohol use (Z78.9)   rare 2. Caffeine use (F15.90) 3. Father deceased   dec age 41yo Parkinsons dz 4. Former smoker [phone])   smoked 1/2 ppd x 15 years, quit 31 years ago 88. Mother deceased   dec age 7yo stroke/old age 37. 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 Widowed  Assessment Assessed  1. Cystocele, midline (N81.11)  Plan Uterine prolapse  1. Gynecology Referral Referral  Referral  Status: Hold For - Appointment,Records   Requested for: 03Aug2016  Discussion/Summary   A picture was drawn. Watchful waiting versus pessary discussed. I discussed the transvaginal hysterectomy with vault suspension, cystocele repair and graft, and possible posterior repair.  I drew her a picture and we talked about prolapse surgery in detail. Pros, cons,  general surgical and anesthetic risks, and other options including behavioral therapy, pessaries, and watchful waiting were discussed. She understands that prolapse repairs are successful in 80-85% of cases for prolapse symptoms and can recur anteriorly, posteriorly, and/or  apically. She understands that in most cases I use a graft and general risks were discussed. Surgical risks were described but not limited to the discussion of injury to neighboring structures including the bowel (with possible life-threatening sepsis and colostomy), bladder, urethra, vagina (all resulting in further surgery), and ureter (resulting in re-implantation). We talked about injury to nerves/soft tissue leading to debilitating and intractable pelvic, abdominal, and lower extremity pain syndromes and neuropathies. The risks of buttock pain, intractable dyspareunia, and vaginal narrowing and shortening with sequelae were discussed. Bleeding risks, transfusion rates, and infection were discussed. The risk of persistent, de novo, or worsening bladder and/or bowel incontinence/dysfunction was discussed. The need for CIC was described as well the usual post-operative course. The patient understands that she might not reach her treatment goal and that she might be worse following surgery.  We talked watchful waiting versus medical and behavioral therapy versus sling.  We talked about a sling in detail. Pros, cons, general surgical and anesthetic risks, and other options including behavioral therapy and watchful waiting were discussed. She understands that slings are generally successful in 90% of cases for stress incontinence, 50% for urge incontinence, and that in a small percentage of cases the incontinence can worsen. The risk of persistent, de novo, or worsening incontinence/dysfunction was discussed. Risks were described but not limited to the discussion of injury to neighboring structures including the bowel (with possible life-threatening sepsis and colostomy), bladder, urethra, vagina (all resulting in further surgery), and ureter (resulting in re-implantation). We also talked about the risk of retention requiring urethrolysis, extrusion requiring revision, and erosion resulting in further surgery.  Bleeding risks and transfusion rates and the risk of infection were discussed. The risk of pelvic and abdominal pain syndromes, dyspareunia, and neuropathies were discussed. The need for CIC was described as well as the usual postoperative course. The patient understands that she might not reach her treatment goal and that she might be worse following surgery. Mesh TV issues were discussed.  Mesh issues on TV discussed.   Ms Wyka would like to Dr Servando Salina to discuss a hysterectomy. She is not sexually active. She does not wish a pessary. She is going to think about whether or not she is going to have a sling. Delayed sling if needed was also discussed. We will ask her this if and when we schedule surgery.  After a thorough review of the management options for the patient's condition the patient  elected to proceed with surgical therapy as noted above. We have discussed the potential benefits and risks of the procedure, side effects of the proposed treatment, the likelihood of the patient achieving the goals of the procedure, and any potential problems that might occur during the procedure or recuperation. Informed consent has been obtained.

## 2015-10-19 ENCOUNTER — Ambulatory Visit (HOSPITAL_COMMUNITY): Payer: Medicare Other | Admitting: Anesthesiology

## 2015-10-19 ENCOUNTER — Observation Stay (HOSPITAL_COMMUNITY)
Admission: RE | Admit: 2015-10-19 | Discharge: 2015-10-20 | Disposition: A | Discharge status: Home / Self Care | Payer: Medicare Other | Source: Ambulatory Visit | Attending: Urology | Admitting: Urology

## 2015-10-19 ENCOUNTER — Encounter (HOSPITAL_COMMUNITY): Payer: Self-pay | Admitting: *Deleted

## 2015-10-19 ENCOUNTER — Encounter (HOSPITAL_COMMUNITY)
Admission: RE | Disposition: A | Discharge status: Home / Self Care | Payer: Self-pay | Source: Ambulatory Visit | Attending: Urology

## 2015-10-19 DIAGNOSIS — Z885 Allergy status to narcotic agent status: Secondary | ICD-10-CM | POA: Insufficient documentation

## 2015-10-19 DIAGNOSIS — Z800 Family history of malignant neoplasm of digestive organs: Secondary | ICD-10-CM | POA: Diagnosis not present

## 2015-10-19 DIAGNOSIS — Z7982 Long term (current) use of aspirin: Secondary | ICD-10-CM | POA: Diagnosis not present

## 2015-10-19 DIAGNOSIS — N814 Uterovaginal prolapse, unspecified: Secondary | ICD-10-CM | POA: Diagnosis not present

## 2015-10-19 DIAGNOSIS — Z91040 Latex allergy status: Secondary | ICD-10-CM | POA: Insufficient documentation

## 2015-10-19 DIAGNOSIS — D259 Leiomyoma of uterus, unspecified: Secondary | ICD-10-CM | POA: Insufficient documentation

## 2015-10-19 DIAGNOSIS — R011 Cardiac murmur, unspecified: Secondary | ICD-10-CM | POA: Diagnosis not present

## 2015-10-19 DIAGNOSIS — N3946 Mixed incontinence: Principal | ICD-10-CM | POA: Insufficient documentation

## 2015-10-19 DIAGNOSIS — Z79899 Other long term (current) drug therapy: Secondary | ICD-10-CM | POA: Diagnosis not present

## 2015-10-19 DIAGNOSIS — IMO0002 Reserved for concepts with insufficient information to code with codable children: Secondary | ICD-10-CM | POA: Diagnosis present

## 2015-10-19 DIAGNOSIS — E7800 Pure hypercholesterolemia, unspecified: Secondary | ICD-10-CM | POA: Insufficient documentation

## 2015-10-19 DIAGNOSIS — N816 Rectocele: Secondary | ICD-10-CM | POA: Insufficient documentation

## 2015-10-19 DIAGNOSIS — Z882 Allergy status to sulfonamides status: Secondary | ICD-10-CM | POA: Insufficient documentation

## 2015-10-19 HISTORY — PX: CYSTOSCOPY: SHX5120

## 2015-10-19 HISTORY — PX: VAGINAL HYSTERECTOMY: SHX2639

## 2015-10-19 HISTORY — PX: ANTERIOR AND POSTERIOR REPAIR: SHX5121

## 2015-10-19 HISTORY — PX: SALPINGOOPHORECTOMY: SHX82

## 2015-10-19 HISTORY — PX: PUBOVAGINAL SLING: SHX1035

## 2015-10-19 LAB — BASIC METABOLIC PANEL
ANION GAP: 6 (ref 5–15)
BUN: 22 mg/dL — ABNORMAL HIGH (ref 6–20)
CALCIUM: 8.5 mg/dL — AB (ref 8.9–10.3)
CHLORIDE: 107 mmol/L (ref 101–111)
CO2: 27 mmol/L (ref 22–32)
Creatinine, Ser: 0.72 mg/dL (ref 0.44–1.00)
GFR calc non Af Amer: >60 mL/min (ref >60)
GLUCOSE: 136 mg/dL — AB (ref 65–99)
POTASSIUM: 3.3 mmol/L — AB (ref 3.5–5.1)
Sodium: 140 mmol/L (ref 135–145)

## 2015-10-19 LAB — HEMOGLOBIN AND HEMATOCRIT, BLOOD
HEMATOCRIT: 39.9 % (ref 36.0–46.0)
Hemoglobin: 13.2 g/dL (ref 12.0–15.0)

## 2015-10-19 LAB — TYPE AND SCREEN
ABO/RH(D): B POS
Antibody Screen: NEGATIVE

## 2015-10-19 SURGERY — CYSTOSCOPY
Anesthesia: General

## 2015-10-19 MED ORDER — CIPROFLOXACIN IN D5W 400 MG/200ML IV SOLN
400.0000 mg | INTRAVENOUS | Status: AC
  Administered 2015-10-19: 400 mg via INTRAVENOUS

## 2015-10-19 MED ORDER — FENTANYL CITRATE (PF) 250 MCG/5ML IJ SOLN
INTRAMUSCULAR | Status: AC
  Filled 2015-10-19: qty 25

## 2015-10-19 MED ORDER — KETOROLAC TROMETHAMINE 30 MG/ML IJ SOLN
INTRAMUSCULAR | Status: DC | PRN
  Administered 2015-10-19: 30 mg via INTRAVENOUS

## 2015-10-19 MED ORDER — FENTANYL CITRATE (PF) 100 MCG/2ML IJ SOLN
INTRAMUSCULAR | Status: DC | PRN
  Administered 2015-10-19 (×6): 25 ug via INTRAVENOUS
  Administered 2015-10-19: 100 ug via INTRAVENOUS
  Administered 2015-10-19: 25 ug via INTRAVENOUS

## 2015-10-19 MED ORDER — SENNOSIDES-DOCUSATE SODIUM 8.6-50 MG PO TABS
2.0000 | ORAL_TABLET | Freq: Every day | ORAL | Status: DC
  Administered 2015-10-19: 2 via ORAL
  Filled 2015-10-19: qty 2

## 2015-10-19 MED ORDER — FENTANYL CITRATE (PF) 100 MCG/2ML IJ SOLN
INTRAMUSCULAR | Status: AC
  Filled 2015-10-19: qty 4

## 2015-10-19 MED ORDER — LIDOCAINE HCL (CARDIAC) 20 MG/ML IV SOLN
INTRAVENOUS | Status: AC
  Filled 2015-10-19: qty 5

## 2015-10-19 MED ORDER — CEFAZOLIN SODIUM-DEXTROSE 2-3 GM-% IV SOLR
2.0000 g | INTRAVENOUS | Status: AC
  Administered 2015-10-19: 2 g via INTRAVENOUS

## 2015-10-19 MED ORDER — CEFAZOLIN SODIUM 1-5 GM-% IV SOLN
1.0000 g | Freq: Three times a day (TID) | INTRAVENOUS | Status: AC
  Administered 2015-10-19 (×2): 1 g via INTRAVENOUS
  Filled 2015-10-19 (×2): qty 50

## 2015-10-19 MED ORDER — ROCURONIUM BROMIDE 100 MG/10ML IV SOLN
INTRAVENOUS | Status: AC
  Filled 2015-10-19: qty 1

## 2015-10-19 MED ORDER — ONDANSETRON HCL 4 MG/2ML IJ SOLN: 4.0000 mg | Freq: Once | INTRAMUSCULAR | Status: DC | PRN

## 2015-10-19 MED ORDER — SODIUM CHLORIDE 0.9 % IJ SOLN: 3.0000 mL | Freq: Two times a day (BID) | INTRAMUSCULAR | Status: DC

## 2015-10-19 MED ORDER — ACETAMINOPHEN 500 MG PO TABS
1000.0000 mg | ORAL_TABLET | Freq: Once | ORAL | Status: AC
  Administered 2015-10-19: 1000 mg via ORAL

## 2015-10-19 MED ORDER — ONDANSETRON HCL 4 MG/2ML IJ SOLN
INTRAMUSCULAR | Status: DC | PRN
  Administered 2015-10-19: 4 mg via INTRAVENOUS

## 2015-10-19 MED ORDER — CIPROFLOXACIN HCL 500 MG PO TABS: 500.0000 mg | ORAL_TABLET | Freq: Two times a day (BID) | ORAL | Status: DC

## 2015-10-19 MED ORDER — ONDANSETRON HCL 4 MG/2ML IJ SOLN
INTRAMUSCULAR | Status: AC
  Filled 2015-10-19: qty 2

## 2015-10-19 MED ORDER — ACETAMINOPHEN 325 MG PO TABS
ORAL_TABLET | ORAL | Status: AC
  Filled 2015-10-19: qty 1

## 2015-10-19 MED ORDER — MEMANTINE HCL 10 MG PO TABS
10.0000 mg | ORAL_TABLET | Freq: Two times a day (BID) | ORAL | Status: DC
  Administered 2015-10-19 – 2015-10-20 (×2): 10 mg via ORAL
  Filled 2015-10-19 (×2): qty 1

## 2015-10-19 MED ORDER — CIPROFLOXACIN IN D5W 400 MG/200ML IV SOLN
400.0000 mg | Freq: Two times a day (BID) | INTRAVENOUS | Status: AC
  Administered 2015-10-19: 400 mg via INTRAVENOUS
  Filled 2015-10-19: qty 200

## 2015-10-19 MED ORDER — LIDOCAINE-EPINEPHRINE (PF) 1 %-1:200000 IJ SOLN
INTRAMUSCULAR | Status: AC
  Filled 2015-10-19: qty 90

## 2015-10-19 MED ORDER — ACETAMINOPHEN 325 MG PO TABS
ORAL_TABLET | ORAL | Status: AC
  Filled 2015-10-19: qty 2

## 2015-10-19 MED ORDER — ONDANSETRON HCL 4 MG/2ML IJ SOLN
4.0000 mg | INTRAMUSCULAR | Status: DC | PRN
Start: 2015-10-19 — End: 2015-10-20
  Administered 2015-10-19: 4 mg via INTRAVENOUS
  Filled 2015-10-19: qty 2

## 2015-10-19 MED ORDER — FENTANYL CITRATE (PF) 100 MCG/2ML IJ SOLN: 25.0000 ug | INTRAMUSCULAR | Status: DC | PRN

## 2015-10-19 MED ORDER — SODIUM CHLORIDE 0.9 % IR SOLN
Status: AC
  Filled 2015-10-19: qty 1

## 2015-10-19 MED ORDER — OXYCODONE HCL 5 MG PO TABS
5.0000 mg | ORAL_TABLET | ORAL | Status: DC | PRN
  Filled 2015-10-19: qty 1

## 2015-10-19 MED ORDER — SODIUM CHLORIDE 0.9 % IR SOLN
Status: DC | PRN
  Administered 2015-10-19: 3000 mL via INTRAVESICAL

## 2015-10-19 MED ORDER — SODIUM CHLORIDE 0.9 % IV SOLN: 250.0000 mL | INTRAVENOUS | Status: DC | PRN

## 2015-10-19 MED ORDER — DEXAMETHASONE SODIUM PHOSPHATE 10 MG/ML IJ SOLN
INTRAMUSCULAR | Status: AC
  Filled 2015-10-19: qty 1

## 2015-10-19 MED ORDER — LIDOCAINE-EPINEPHRINE (PF) 1 %-1:200000 IJ SOLN
INTRAMUSCULAR | Status: DC | PRN
  Administered 2015-10-19: 39 mL

## 2015-10-19 MED ORDER — MIDAZOLAM HCL 2 MG/2ML IJ SOLN
INTRAMUSCULAR | Status: AC
  Filled 2015-10-19: qty 4

## 2015-10-19 MED ORDER — ROCURONIUM BROMIDE 100 MG/10ML IV SOLN
INTRAVENOUS | Status: DC | PRN
  Administered 2015-10-19 (×2): 20 mg via INTRAVENOUS
  Administered 2015-10-19: 50 mg via INTRAVENOUS
  Administered 2015-10-19 (×2): 10 mg via INTRAVENOUS

## 2015-10-19 MED ORDER — NEOSTIGMINE METHYLSULFATE 10 MG/10ML IV SOLN
INTRAVENOUS | Status: DC | PRN
  Administered 2015-10-19: 4 mg via INTRAVENOUS

## 2015-10-19 MED ORDER — CEFAZOLIN SODIUM-DEXTROSE 2-3 GM-% IV SOLR
INTRAVENOUS | Status: AC
  Filled 2015-10-19: qty 50

## 2015-10-19 MED ORDER — ACETAMINOPHEN 500 MG PO TABS
1000.0000 mg | ORAL_TABLET | Freq: Four times a day (QID) | ORAL | Status: AC
  Administered 2015-10-19 – 2015-10-20 (×3): 1000 mg via ORAL
  Filled 2015-10-19 (×4): qty 2

## 2015-10-19 MED ORDER — PROPOFOL 10 MG/ML IV BOLUS
INTRAVENOUS | Status: AC
  Filled 2015-10-19: qty 20

## 2015-10-19 MED ORDER — VITAMINS A & D EX OINT
TOPICAL_OINTMENT | CUTANEOUS | Status: AC
  Administered 2015-10-19: 21:00:00
  Filled 2015-10-19: qty 5

## 2015-10-19 MED ORDER — ESTRADIOL 0.1 MG/GM VA CREA
TOPICAL_CREAM | VAGINAL | Status: DC | PRN
  Administered 2015-10-19: 1 via VAGINAL

## 2015-10-19 MED ORDER — POLYETHYLENE GLYCOL 3350 17 G PO PACK: 17.0000 g | PACK | Freq: Every day | ORAL | Status: DC | PRN

## 2015-10-19 MED ORDER — SODIUM CHLORIDE 0.9 % IV SOLN
INTRAVENOUS | Status: DC
  Administered 2015-10-19 – 2015-10-20 (×2): via INTRAVENOUS

## 2015-10-19 MED ORDER — PHENAZOPYRIDINE HCL 200 MG PO TABS
200.0000 mg | ORAL_TABLET | Freq: Once | ORAL | Status: AC
  Administered 2015-10-19: 200 mg via ORAL

## 2015-10-19 MED ORDER — DEXAMETHASONE SODIUM PHOSPHATE 4 MG/ML IJ SOLN
INTRAMUSCULAR | Status: DC | PRN
  Administered 2015-10-19: 10 mg via INTRAVENOUS

## 2015-10-19 MED ORDER — METHYLENE BLUE 1 % INJ SOLN
INTRAMUSCULAR | Status: AC
  Filled 2015-10-19: qty 10

## 2015-10-19 MED ORDER — STERILE WATER FOR IRRIGATION IR SOLN
Status: DC | PRN
  Administered 2015-10-19: 1

## 2015-10-19 MED ORDER — ESTRADIOL 0.1 MG/GM VA CREA
TOPICAL_CREAM | VAGINAL | Status: AC
  Filled 2015-10-19: qty 85

## 2015-10-19 MED ORDER — SODIUM CHLORIDE 0.9 % IJ SOLN: 3.0000 mL | INTRAMUSCULAR | Status: DC | PRN

## 2015-10-19 MED ORDER — KETOROLAC TROMETHAMINE 30 MG/ML IJ SOLN
INTRAMUSCULAR | Status: AC
  Filled 2015-10-19: qty 1

## 2015-10-19 MED ORDER — OXYCODONE-ACETAMINOPHEN 5-325 MG PO TABS: 1.0000 | ORAL_TABLET | ORAL | Status: DC | PRN

## 2015-10-19 MED ORDER — CIPROFLOXACIN IN D5W 400 MG/200ML IV SOLN
INTRAVENOUS | Status: AC
  Filled 2015-10-19: qty 200

## 2015-10-19 MED ORDER — MORPHINE SULFATE (PF) 2 MG/ML IV SOLN: 1.0000 mg | INTRAVENOUS | Status: DC | PRN

## 2015-10-19 MED ORDER — PROPOFOL 10 MG/ML IV BOLUS
INTRAVENOUS | Status: DC | PRN
  Administered 2015-10-19: 100 mg via INTRAVENOUS

## 2015-10-19 MED ORDER — LACTATED RINGERS IV SOLN
INTRAVENOUS | Status: DC | PRN
  Administered 2015-10-19 (×3): via INTRAVENOUS

## 2015-10-19 MED ORDER — LIDOCAINE HCL (CARDIAC) 20 MG/ML IV SOLN
INTRAVENOUS | Status: DC | PRN
  Administered 2015-10-19: 60 mg via INTRAVENOUS

## 2015-10-19 MED ORDER — GLYCOPYRROLATE 0.2 MG/ML IJ SOLN
INTRAMUSCULAR | Status: DC | PRN
  Administered 2015-10-19: 0.6 mg via INTRAVENOUS

## 2015-10-19 SURGICAL SUPPLY — 72 items
BAG URINE DRAINAGE (UROLOGICAL SUPPLIES) ×4 IMPLANT
BLADE HEX COATED 2.75 (ELECTRODE) ×4 IMPLANT
BLADE SURG 15 STRL LF DISP TIS (BLADE) ×4 IMPLANT
BLADE SURG 15 STRL SS (BLADE) ×8
CATH FOLEY 2WAY SLVR  5CC 14FR (CATHETERS) ×2
CATH FOLEY 2WAY SLVR 5CC 14FR (CATHETERS) ×2 IMPLANT
CATH FOLEY LATEX FREE 16FR (CATHETERS) ×2 IMPLANT
CLOTH BEACON ORANGE TIMEOUT ST (SAFETY) ×4 IMPLANT
COVER MAYO STAND STRL (DRAPES) IMPLANT
COVER SURGICAL LIGHT HANDLE (MISCELLANEOUS) ×4 IMPLANT
DECANTER SPIKE VIAL GLASS SM (MISCELLANEOUS) ×8 IMPLANT
DEVICE CAPIO SLIM SINGLE (INSTRUMENTS) ×2 IMPLANT
DRAIN PENROSE 18X1/4 LTX STRL (WOUND CARE) ×2 IMPLANT
DRAIN PENROSE LF 8X20.3CM SIL (WOUND CARE) ×2 IMPLANT
DRAPE SHEET LG 3/4 BI-LAMINATE (DRAPES) ×4 IMPLANT
DRAPE UTILITY XL STRL (DRAPES) ×4 IMPLANT
ELECT LIGASURE LONG (ELECTRODE) ×4 IMPLANT
ELECT LIGASURE SHORT 9 REUSE (ELECTRODE) ×2 IMPLANT
ELECT PENCIL ROCKER SW 15FT (MISCELLANEOUS) ×4 IMPLANT
ELECT REM PT RETURN 9FT ADLT (ELECTROSURGICAL) ×4
ELECTRODE REM PT RTRN 9FT ADLT (ELECTROSURGICAL) ×2 IMPLANT
GAUZE PACKING 2X5 YD STRL (GAUZE/BANDAGES/DRESSINGS) ×8 IMPLANT
GAUZE SPONGE 4X4 16PLY XRAY LF (GAUZE/BANDAGES/DRESSINGS) ×8 IMPLANT
GLOVE BIOGEL PI IND STRL 6.5 (GLOVE) IMPLANT
GLOVE BIOGEL PI INDICATOR 6.5 (GLOVE) ×6
GLOVE ECLIPSE 8.0 STRL XLNG CF (GLOVE) ×8 IMPLANT
GLOVE SURG SS PI 6.0 STRL IVOR (GLOVE) ×2 IMPLANT
GLOVE SURG SS PI 6.5 STRL IVOR (GLOVE) ×9 IMPLANT
GLOVE SURG SS PI 8.0 STRL IVOR (GLOVE) ×8 IMPLANT
GOWN STRL REUS W/TWL LRG LVL3 (GOWN DISPOSABLE) ×8 IMPLANT
GOWN STRL REUS W/TWL XL LVL3 (GOWN DISPOSABLE) ×16 IMPLANT
HOLDER FOLEY CATH W/STRAP (MISCELLANEOUS) ×4 IMPLANT
IV NS 1000ML (IV SOLUTION) ×4
IV NS 1000ML BAXH (IV SOLUTION) ×2 IMPLANT
KIT BASIN OR (CUSTOM PROCEDURE TRAY) ×4 IMPLANT
LIQUID BAND (GAUZE/BANDAGES/DRESSINGS) ×5 IMPLANT
MANIFOLD NEPTUNE II (INSTRUMENTS) ×4 IMPLANT
NDL MAYO 6 CRC TAPER PT (NEEDLE) ×2 IMPLANT
NDL SPNL 22GX3.5 QUINCKE BK (NEEDLE) ×2 IMPLANT
NEEDLE HYPO 22GX1.5 SAFETY (NEEDLE) ×2 IMPLANT
NEEDLE MAYO 6 CRC TAPER PT (NEEDLE) ×4 IMPLANT
NEEDLE SPNL 22GX3.5 QUINCKE BK (NEEDLE) ×4 IMPLANT
PACK CYSTO (CUSTOM PROCEDURE TRAY) ×4 IMPLANT
PACK GENERAL/GYN (CUSTOM PROCEDURE TRAY) ×4 IMPLANT
PLUG CATH AND CAP STER (CATHETERS) ×4 IMPLANT
POSITIONER SURGICAL ARM (MISCELLANEOUS) ×4 IMPLANT
RETRACTOR STAY HOOK 5MM (MISCELLANEOUS) ×4 IMPLANT
SHEET LAVH (DRAPES) ×4 IMPLANT
SLING SUPRIS RETROPUBIC KIT (SLING) ×2 IMPLANT
SPONGE LAP 4X18 X RAY DECT (DISPOSABLE) ×4 IMPLANT
SUT VIC AB 0 CT1 18XCR BRD 8 (SUTURE) IMPLANT
SUT VIC AB 0 CT1 27 (SUTURE) ×8
SUT VIC AB 0 CT1 27XBRD ANTBC (SUTURE) IMPLANT
SUT VIC AB 0 CT1 36 (SUTURE) ×2 IMPLANT
SUT VIC AB 0 CT1 8-18 (SUTURE) ×4
SUT VIC AB 2-0 CT1 27 (SUTURE) ×8
SUT VIC AB 2-0 CT1 27XBRD (SUTURE) IMPLANT
SUT VIC AB 2-0 SH 27 (SUTURE) ×8
SUT VIC AB 2-0 SH 27X BRD (SUTURE) IMPLANT
SUT VIC AB 4-0 PS2 18 (SUTURE) ×2 IMPLANT
SUT VICRYL 0 UR6 27IN ABS (SUTURE) ×4 IMPLANT
SYRINGE 10CC LL (SYRINGE) ×4 IMPLANT
TOWEL NATURAL 6PK STERILE (DISPOSABLE) ×8 IMPLANT
TOWEL OR 17X26 10 PK STRL BLUE (TOWEL DISPOSABLE) ×4 IMPLANT
TOWEL OR NON WOVEN STRL DISP B (DISPOSABLE) ×4 IMPLANT
TRAY FOLEY CATH 16FR SILVER (SET/KITS/TRAYS/PACK) ×4 IMPLANT
TUBING CONNECTING 10 (TUBING) ×3 IMPLANT
TUBING CONNECTING 10' (TUBING) ×1
WATER STERILE IRR 1500ML POUR (IV SOLUTION) ×4 IMPLANT
WATER STERILE IRR 500ML POUR (IV SOLUTION) ×4 IMPLANT
YANKAUER SUCT BULB TIP 10FT TU (MISCELLANEOUS) ×4 IMPLANT
sling supris retropubic ×2 IMPLANT

## 2015-10-19 NOTE — Progress Notes (Signed)
PHARMACY NOTE -    Pharmacy has been consulted to renally adjust antibiotics. Dosage remains stable at cefazolin x 2 doses and cipro x 1 dose. Need for further dosage adjustment appears unlikely at present.    Will sign off at this time.  Please reconsult if a change in clinical status warrants re-evaluation of dosage.  Dolly Rias RPh 10/19/2015, 12:48 PM Pager [phone]

## 2015-10-19 NOTE — Brief Op Note (Signed)
10/19/2015  11:05 AM  PATIENT:  Amanda Contreras  68 y.o. female  PRE-OPERATIVE DIAGNOSIS:  CYSTOCELE,RECTOCELE,VAULT PROLAPSE, uterovaginal prolapse  POST-OPERATIVE DIAGNOSIS:  CYSTOCELE, vault prolapse, uterovaginal prolapse,  PROCEDURE:  Total vaginal hysterectomy, distal left salpingectomy  SURGEON:  Surgeon(s) and Role: Panel 1:    * Bjorn Loser, MD - Primary  Panel 2:    * Servando Salina, MD - Primary  PHYSICIAN ASSISTANT:   ASSISTANTS: Dr Nicki Reaper MacDiarmid Dr Harvel Ricks   ANESTHESIA:   general FINDINGS;  Nl ovaries, elongated right tube, uterus with fibroid  EBL:  Total I/O In: 2000 [I.V.:2000] Out: 350 [Urine:300; Blood:50]  BLOOD ADMINISTERED:none  DRAINS: none   LOCAL MEDICATIONS USED:  OTHER Lidocaine with epinephrine  SPECIMEN:  Source of Specimen:  uterus, cervix, distal tube  DISPOSITION OF SPECIMEN:  PATHOLOGY  COUNTS:  YES  TOURNIQUET:  * No tourniquets in log *  DICTATION: .Other Dictation: Dictation Number [id number]  PLAN OF CARE: Admit for overnight observation  PATIENT DISPOSITION:  PACU - hemodynamically stable.   Delay start of Pharmacological VTE agent (>24hrs) due to surgical blood loss or risk of bleeding: no

## 2015-10-19 NOTE — Op Note (Signed)
Preoperative diagnosis: Cystocele and mild vault prolapse and mild rectocele and stress urinary incontinence Postoperative diagnosis cystocele and mild vault prolapse and mild rectocele and stress urinary incontinence Surgery: Cystocele repair and sling cystoscopy urethropexy and cystoscopy Surgeon: Dr. Nicki Reaper Akshay Spang Asst.: Dr. Catalina Gravel cousins  Resident: Dr. Harvel Ricks  The patient has the above diagnoses and consented to the above procedure. Extra care was taken with leg positioning to minimize risk of compartment syndrome and neuropathy and deep vein thrombosis. Time out was performed. Preoperative antibiotics were given. Under anesthesia she had uterine prolapse and again a very short anterior vaginal wall. She had minimal posterior defect. Dr. cousins performed a transvaginal hysterectomy and removed part left ovary. She left the cuff open and the ureteral sacral ligaments tagged. The ligaments were moderately strong and after the hysterectomy retracted nicely cephalad. She ran the posterior cuff.  The patient had a very short anterior vaginal wall and almost like a golf ball ball cystocele. I instilled 20 mL of a lidocaine epinephrine mixture. I did my usual T-shaped incision at the level of the cuff and sharply and bluntly dissected the overlying vaginal mucosa from the underlying pubocervical fascia. I was very happy with the mobilization to the white line bilaterally and at the apex. Her attachments at the apex were good.  I did a 2 layer repair which greatly reduced the cystocele to being flat. She had good apical support. She had a very narrow pubic arch noted also by Dr. Garwin Brothers. I truly felt that she did not need a vault suspension. She also had a very short anterior vaginal wall as noted. I also extensively examined the posterior cuff and its strength and its length in making my decision.  I cystoscoped the patient prior to my surgery and she good efflux bilaterally. After the  anterior repair I repeated cystoscopy. She had excellent yellow jets bilaterally. The ureters were not pulled medially. There was a good cystocele repair cystoscopically  I only trimmed a few millimeters of vaginal wall mucosa. I close anterior vaginal wall with running 2-0 Vicryl and CT1 needle. The anterior repair did not imbricating the bladder neck.  Dr. cousins in for 4 and a McCall culdoplasty which again support the cuff nicely and she reapproximated the 2 ureteral sacral ligaments. The cuff was closed in a transverse fashion with 0 Vicryl on a CT1 needle from left to right and right to left.  Under anesthesia the patient had excellent length and very good support anteriorly and posteriorly. I did a rectal examination and she has diffuse weakness and elasticity but no posterior defect to imbricate. She really has lost a lot of perineal strength as well.  I then directed my attention to the sling. I made 2 less than 1 cm incisions 1.5 cm lateral to the midline 1 fingerbreadth above the symphysis pubis. She had a mildly shorter urethra that almost was overcorrected at rest. I took this into consideration. I drew out a mid urethral incision with marking pen. I instilled a few cc of lidocaine epinephrine mixture and made appropriate depth incision. I sharply dissected to the urethrovesical angle bilaterally  With the bladder emptied I passed the trocar along the back of the symphysis pubis onto the pulp of my index finger with my usual box technique. The trocar was initially placed on top of the symphysis pubis.  I cystoscoped the patient. She had excellent yellow jets bilaterally. Left side the ureter was getting more faint. There was no trocar in  the bladder or urethra.   With the bladder emptied I pulled the sling up through the retropubic space and tensioned it over the fat part of a moderate Kelly clamp. The sling laid in very nicely in the mid urethra. I was happy with its hypermobility and  tension. If anything it may been 1 or 2 mm on the looser side which was my plan based on her anatomy.  I closed the anterior vaginal wall with running 2-0 Vicryl followed by interrupted suture. Irrigation had been utilized. I cut the sling below the level of the skin suprapubically. I closed the abdominal incisions with one interrupted 4-0 Vicryl and Dermabond  Vaginal pack with Estrace cream was applied  Blood loss was less than 50 mL. Leg position was excellent. Urine output was good.  Hopefully the procedure will reach the patient's treatment goal

## 2015-10-19 NOTE — Anesthesia Preprocedure Evaluation (Addendum)
Anesthesia Evaluation  Patient identified by MRN, date of birth, ID band Patient awake    Reviewed: Allergy & Precautions, NPO status , Patient's Chart, lab work & pertinent test results  History of Anesthesia Complications Negative for: history of anesthetic complications  Airway Mallampati: II  TM Distance: >3 FB Neck ROM: Full    Dental  (+) Teeth Intact, Implants, Caps   Pulmonary neg shortness of breath, neg sleep apnea, neg COPD, neg recent URI, former smoker,    Pulmonary exam normal breath sounds clear to auscultation       Cardiovascular negative cardio ROS Normal cardiovascular exam Rhythm:Regular     Neuro/Psych PSYCHIATRIC DISORDERS Depression negative neurological ROS     GI/Hepatic negative GI ROS, Neg liver ROS,   Endo/Other  negative endocrine ROS  Renal/GU negative Renal ROS     Musculoskeletal  (+) Arthritis , Osteoarthritis,    Abdominal   Peds  Hematology negative hematology ROS (+)   Anesthesia Other Findings Day of surgery medications reviewed with the patient.  Reproductive/Obstetrics                            Anesthesia Physical Anesthesia Plan  ASA: II  Anesthesia Plan: General   Post-op Pain Management:    Induction: Intravenous  Airway Management Planned: Oral ETT  Additional Equipment:   Intra-op Plan:   Post-operative Plan: Extubation in OR  Informed Consent: I have reviewed the patients History and Physical, chart, labs and discussed the procedure including the risks, benefits and alternatives for the proposed anesthesia with the patient or authorized representative who has indicated his/her understanding and acceptance.   Dental advisory given  Plan Discussed with: CRNA  Anesthesia Plan Comments: (Risks/benefits of general anesthesia discussed with patient including risk of damage to teeth, lips, gum, and tongue, nausea/vomiting, allergic  reactions to medications, and the possibility of heart attack, stroke and death.  All patient questions answered.  Patient wishes to proceed.)        Anesthesia Quick Evaluation

## 2015-10-19 NOTE — Transfer of Care (Signed)
Immediate Anesthesia Transfer of Care Note  Patient: Amanda Contreras  Procedure(s) Performed: Procedure(s) (LRB): CYSTOSCOPY (N/A) ANTERIOR (CYSTOCELE)  (N/A) SUPUS SLING   (N/A) HYSTERECTOMY VAGINAL (N/A) BILATERAL SALPINGO OOPHORECTOMY (Bilateral)  Patient Location: PACU  Anesthesia Type: General  Level of Consciousness: awake, sedated, patient cooperative and responds to stimulation  Airway & Oxygen Therapy: Patient Spontanous Breathing and Patient connected to face mask oxygen  Post-op Assessment: Report given to PACU RN, Post -op Vital signs reviewed and stable and Patient moving all extremities  Post vital signs: Reviewed and stable  Complications: No apparent anesthesia complications

## 2015-10-19 NOTE — Anesthesia Procedure Notes (Signed)
Procedure Name: Intubation Date/Time: 10/19/2015 7:44 AM Performed by: Justice Rocher Pre-anesthesia Checklist: Patient identified, Emergency Drugs available, Suction available and Patient being monitored Patient Re-evaluated:Patient Re-evaluated prior to inductionOxygen Delivery Method: Circle System Utilized Preoxygenation: Pre-oxygenation with 100% oxygen Intubation Type: IV induction Ventilation: Mask ventilation without difficulty Laryngoscope Size: Mac and 3 Grade View: Grade I Tube type: Oral Number of attempts: 1 Airway Equipment and Method: Stylet and Oral airway Placement Confirmation: ETT inserted through vocal cords under direct vision,  positive ETCO2 and breath sounds checked- equal and bilateral Secured at: 23 cm Tube secured with: Tape Dental Injury: Teeth and Oropharynx as per pre-operative assessment

## 2015-10-19 NOTE — Interval H&P Note (Signed)
History and Physical Interval Note:  10/19/2015 7:13 AM  Amanda Contreras  has presented today for surgery, with the diagnosis of CYSTOCELE,RECTOCELE,VAULT PROLAPSE  The various methods of treatment have been discussed with the patient and family. After consideration of risks, benefits and other options for treatment, the patient has consented to  Procedure(s): CYSTOSCOPY (N/A) ANTERIOR (CYSTOCELE) AND POSTERIOR REPAIR (RECTOCELE) (N/A) VAULT PROLAPSE (N/A) SUPUS SLING  AND GRAFT (N/A) HYSTERECTOMY VAGINAL (N/A) BILATERAL SALPINGO OOPHORECTOMY (Bilateral) as a surgical intervention .  The patient's history has been reviewed, patient examined, no change in status, stable for surgery.  I have reviewed the patient's chart and labs.  Questions were answered to the patient's satisfaction.     Anea Fodera A

## 2015-10-19 NOTE — Anesthesia Postprocedure Evaluation (Signed)
  Anesthesia Post-op Note  Patient: Amanda Contreras  Procedure(s) Performed: Procedure(s) (LRB): CYSTOSCOPY (N/A) ANTERIOR (CYSTOCELE)  (N/A) SUPUS SLING   (N/A) HYSTERECTOMY VAGINAL (N/A) BILATERAL SALPINGO OOPHORECTOMY (Bilateral)  Patient Location: PACU  Anesthesia Type: General  Level of Consciousness: awake and alert   Airway and Oxygen Therapy: Patient Spontanous Breathing  Post-op Pain: mild  Post-op Assessment: Post-op Vital signs reviewed, Patient's Cardiovascular Status Stable, Respiratory Function Stable, Patent Airway and No signs of Nausea or vomiting  Last Vitals:  Filed Vitals:   10/19/15 1214  BP:   Pulse:   Temp: 36.4 C  Resp:     Post-op Vital Signs: stable   Complications: No apparent anesthesia complications

## 2015-10-20 ENCOUNTER — Encounter (HOSPITAL_COMMUNITY): Payer: Self-pay | Admitting: Urology

## 2015-10-20 DIAGNOSIS — N3946 Mixed incontinence: Secondary | ICD-10-CM | POA: Diagnosis not present

## 2015-10-20 LAB — BASIC METABOLIC PANEL
ANION GAP: 3 — AB (ref 5–15)
BUN: 13 mg/dL (ref 6–20)
CALCIUM: 8.5 mg/dL — AB (ref 8.9–10.3)
CHLORIDE: 112 mmol/L — AB (ref 101–111)
CO2: 25 mmol/L (ref 22–32)
Creatinine, Ser: 0.58 mg/dL (ref 0.44–1.00)
GFR calc Af Amer: >60 mL/min (ref >60)
GFR calc non Af Amer: >60 mL/min (ref >60)
GLUCOSE: 134 mg/dL — AB (ref 65–99)
POTASSIUM: 3.7 mmol/L (ref 3.5–5.1)
Sodium: 140 mmol/L (ref 135–145)

## 2015-10-20 LAB — HEMOGLOBIN AND HEMATOCRIT, BLOOD
HEMATOCRIT: 36.7 % (ref 36.0–46.0)
Hemoglobin: 11.9 g/dL — ABNORMAL LOW (ref 12.0–15.0)

## 2015-10-20 NOTE — Op Note (Signed)
NAMEMARYLN, Amanda Contreras                ACCOUNT NO.:  [id number]  MEDICAL RECORD NO.:  [id number]  LOCATION:  [id number]                         FACILITY:  [facility]  PHYSICIAN:  Servando Salina, M.D.DATE OF BIRTH:  09-04-1946  DATE OF PROCEDURE:  10/19/2015 DATE OF DISCHARGE:                              OPERATIVE REPORT   PREOPERATIVE DIAGNOSES:  Cystocele, rectocele, vault prolapse,second degree uterovaginal prolapse.  PROCEDURES:  Total vaginal hysterectomy, distal left fallopian tube.  POSTOPERATIVE DIAGNOSES:  Cystocele, stress urinary incontinence, second- degree uterovaginal prolapse.  ANESTHESIA:  General.  SURGEON:  Servando Salina, M.D.  ASSISTANT:  Reece Packer, M.D. and Dr. Harvel Ricks( resident).  DESCRIPTION OF PROCEDURE:  Under adequate general anesthesia, the patient was placed in dorsal lithotomy position.  Her legs were positioned by Dr. Matilde Sprang prior to prepping the patient.  The patient was sterilely prepped and draped in usual fashion.  Examination had revealed a cervix up to the level of the introitus.  Small uterus.  No adnexal masses could be appreciated.  Indwelling Foley catheter was placed.  A weighted speculum was placed in the vagina.  Curved retractors placed anteriorly.  The cervix was grasped with a clamp and tenaculum.  The cervicovaginal junction was identified.  Lidocaine 1% with epinephrine was injected circumferentially at the cervicovaginal junction.  A circumferential incision was then made and posteriorly. The posterior cul-de-sac was easily opened transversely.  The vaginal cuff was oversewn with 0 Vicryl running locked stitch and the uterosacral ligaments were then bilaterally clamped, cut, and suture ligated with 0 Vicryl suture.  Attention was then turned anteriorly where careful dissection of the anterior cul-de-sac was also opened.  Subsequently, using the LigaSure, the cardinal ligaments were serially clamped, cauterized,  and then cut, so were the uterine vessels which were clamped, cauterized, and cut.  There was a fundal fibroid noted.  The uterus was flipped and the attention was then turned to identify the ovary and tube to just see if they are amenable for removal.  At that point, the left uteroovarian complex was clamped, cut.  The other side was serially cauterized with a LigaSure and then cut.  The fallopian tube was very long on the patient's right.  The ovary was seen, but was very high in the vault.  The left tube distal end was grasped, was able to remove, but proximally that was not amenable for removal in line of field.  The bowels were very prominent despite putting the patient in Trendelenburg position.  At that point,  uterus and cervix, and distal portion of the tube were removed.  Dr. Matilde Sprang stepped in, at which point, he then performed cystoscopy and his other portion of the surgery, please see his dictated operative report.  Once he did that, the [facility] culdoplasty was then placed with 0 Vicryl suture and tied in the midline.  The part of the posterior cuff was then brought together with interrupted 0 Vicryl figure-of-eight sutures and then transversely the remaining portion of the cuff was then closed by Dr. Matilde Sprang with suture.  The vagina was then irrigated.  He performed a pubovaginal sling and examination did not reveal necessity for rectocele.  Specimen was  uterus, cervix, and distal portion of the left tube.  ESTIMATED BLOOD LOSS:  50 mL.  INTRAOPERATIVE FLUID:  About 2 L.  URINE OUTPUT:  About at least 200 mL.  COUNTS:  Sponge and instrument counts x2 were correct.  COMPLICATIONS:  None.  The patient tolerated the procedure well.     Servando Salina, M.D.     [place]/MEDQ  D:  10/20/2015  T:  10/20/2015  Job:  [id number]

## 2015-10-20 NOTE — Progress Notes (Signed)
Looks great No pain Labs normal Vitals normal Instructions detailed with quesitons asnwwered

## 2015-10-20 NOTE — Discharge Instructions (Signed)
I have reviewed discharge instructions in detail with the patient. They will follow-up with me or their physician as scheduled. My nurse will also be calling the patients as per protocol.   

## 2015-10-20 NOTE — Progress Notes (Signed)
GYN  POD #1 Feels well No pain med this am Foley and packing out  O: BP 114/64 mmHg  Pulse 69  Temp(Src) 98.4 F (36.9 C) (Oral)  Resp 16  Ht 5\' 2"  (1.575 m)  Wt 58.968 kg (130 lb)  BMI 23.77 kg/m2  SpO2 100%  Lungs clear to A Cor RRR Abdomen: soft non tender Pelvic: vulva: incision site: ecchymosis Scant blood CBC Latest Ref Rng 10/20/2015 10/19/2015 10/12/2015  WBC 4.0 - 10.5 K/uL - - 6.2  Hemoglobin 12.0 - 15.0 g/dL 11.9(L) 13.2 13.5  Hematocrit 36.0 - 46.0 % 36.7 39.9 42.1  Platelets 150 - 400 K/uL - - 168   Lab Results  Component Value Date   CREATININE 0.58 10/20/2015   CREATININE 0.72 10/19/2015   CREATININE 0.62 10/12/2015   IMP: POD #1 s/p TVH distal left tube Cystocele repair, pubovaginal sling Stable P) voiding trials. D/c home today F/u gyn 6 weeks D/c instructions reviewed with pt and friend

## 2015-10-20 NOTE — Discharge Summary (Signed)
Date of admission: 10/19/2015  Date of discharge: 10/20/2015  Admission diagnosis: Cystocele   Discharge diagnosis: Cystocele  Secondary diagnoses: Vault prolaspe and uterine prolapse  History and Physical: For full details, please see admission history and physical. Briefly, Amanda Contreras is a 69 y.o. year old patient with the above diagnosis.   Hospital Course: Prolapse surgery went very well with uneventful post op course  Laboratory values:  Recent Labs  10/19/15 1602 10/20/15 0453  HGB 13.2 11.9*  HCT 39.9 36.7    Recent Labs  10/19/15 1602 10/20/15 0453  CREATININE 0.72 0.58    Disposition: Home  Discharge instruction: The patient was instructed to be ambulatory but told to refrain from heavy lifting, strenuous activity, or driving. Detailed  Discharge medications:    Medication List    TAKE these medications        ciprofloxacin 500 MG tablet  Commonly known as:  CIPRO  Take 1 tablet (500 mg total) by mouth 2 (two) times daily.     CITRACAL +D3 PO  Take 2 tablets by mouth 2 (two) times daily.     memantine 10 MG tablet  Commonly known as:  NAMENDA  Take 1 tablet (10 mg total) by mouth 2 (two) times daily.     oxyCODONE-acetaminophen 5-325 MG tablet  Commonly known as:  ROXICET  Take 1-2 tablets by mouth every 4 (four) hours as needed for severe pain.     PRESERVISION AREDS 2 PO  Take 1 tablet by mouth 2 (two) times daily.        Followup:      Follow-up Information    Follow up with Jolean Madariaga A, MD.   Specialty:  Urology   Why:  as scheduled   Contact information:   [place] [address] [phone]

## 2015-10-22 ENCOUNTER — Encounter (HOSPITAL_COMMUNITY): Payer: Self-pay | Admitting: Urology

## 2015-12-27 ENCOUNTER — Ambulatory Visit (INDEPENDENT_AMBULATORY_CARE_PROVIDER_SITE_OTHER): Payer: Medicare Other | Admitting: Neurology

## 2015-12-27 ENCOUNTER — Encounter: Payer: Self-pay | Admitting: Neurology

## 2015-12-27 VITALS — BP 121/72 | HR 63 | Ht 62.0 in | Wt 131.0 lb

## 2015-12-27 DIAGNOSIS — R4189 Other symptoms and signs involving cognitive functions and awareness: Secondary | ICD-10-CM | POA: Diagnosis not present

## 2015-12-27 DIAGNOSIS — F0390 Unspecified dementia without behavioral disturbance: Secondary | ICD-10-CM | POA: Insufficient documentation

## 2015-12-27 MED ORDER — DONEPEZIL HCL 10 MG PO TABS: 10.0000 mg | ORAL_TABLET | Freq: Every day | ORAL | Status: DC

## 2015-12-27 NOTE — Progress Notes (Signed)
Chief Complaint  Patient presents with  . Dementia    MMSE 30/30 - 17 animals. She is here with her cousin, [name]. Says she occasionally misses doses of Namenda.  She is unable to tell if it has made a difference.      PATIENT: Amanda Contreras DOB: 11-02-1946  Chief Complaint  Patient presents with  . Dementia    MMSE 30/30 - 17 animals. She is here with her cousin, [name]. Says she occasionally misses doses of Namenda.  She is unable to tell if it has made a difference.     HISTORICAL  Amanda Contreras is a 70 years old right-handed female, seen in refer by  primary care physician [name], MD in September 23 2015 for evaluation of memory loss, she is accompanied by her cousin [name] at today's clinical visit.  She has past medical history of sensory neuronopathy hearing loss since 2012, hyperlipidemia, previous smoker, quit in 1989,  She had masses degree, majored in [place] education, was a retired Pharmacist, hospital at age 4, she was widowed since 2012, also took care of her mother afterwards, who passed away in Spring of 2015.  I saw her previously for double vision in 2013, which has resolved, I personally reviewed MRI of the brain January 2013, mild small vessel disease, generalized atrophy.  Since spring of 2015, she was noted to have word finding difficulties, sometimes driving familiar route, she has to think hard to get to the destination, once she was lost in her urologist office. Still managing her own bill, lives with her dog at her household, forgetting appointments sometimes, she also complains of difficulty falling to sleep, not eating regularly, she is also on diet, lost over 30 pounds since 2015.  Her paternal grandmother suffered dementia.  Update December 27 2015:  She was started on Namenda 10 mg twice a day since October 2016, She tolerates it well. She still has mild memory loss, she lives by her self, she still drives, " have to think where I am going",  She has  GPS, she has trouble keep things organized,  She eats well, mild insomnia, tends to sleep late.    She is seeing an elder care attorney, financial consultation, which has added on pressure to her.   REVIEW OF SYSTEMS: Full 14 system review of systems performed and notable only for ear pain, double vision, blurry vision, frequent awakening, memory loss, depression anxiety  ALLERGIES: Allergies  Allergen Reactions  . Hydrocodone Other (See Comments)    dizziness  . Latex Rash  . Sulfa Antibiotics Rash    HOME MEDICATIONS: Current Outpatient Prescriptions  Medication Sig Dispense Refill  . Calcium-Phosphorus-Vitamin D (CITRACAL +D3 PO) Take 2 tablets by mouth 2 (two) times daily.    . memantine (NAMENDA) 10 MG tablet Take 1 tablet (10 mg total) by mouth 2 (two) times daily. 60 tablet 11  . Multiple Vitamins-Minerals (PRESERVISION AREDS 2 PO) Take 1 tablet by mouth 2 (two) times daily.      No current facility-administered medications for this visit.    PAST MEDICAL HISTORY: Past Medical History  Diagnosis Date  . Heart murmur   . Arthritis   . Genital prolapse   . Hypercholesteremia     PAST SURGICAL HISTORY: Past Surgical History  Procedure Laterality Date  . Dilatation & curettage/hysteroscopy with myosure N/A 09/02/2015    Procedure: [place];  Surgeon: Servando Salina, MD;  Location: [facility];  Service: Gynecology;  Laterality: N/A;  . Uterine/bladder prolapse surgery      Scheduled 10/19/15  . Cystoscopy N/A 10/19/2015    Procedure: CYSTOSCOPY;  Surgeon: Bjorn Loser, MD;  Location: [facility];  Service: Urology;  Laterality: N/A;  . Anterior and posterior repair N/A 10/19/2015    Procedure: ANTERIOR (CYSTOCELE) ;  Surgeon: Bjorn Loser, MD;  Location: [facility];  Service: Urology;  Laterality: N/A;  . Pubovaginal sling N/A 10/19/2015    Procedure: Inetta Fermo  ;  Surgeon: Bjorn Loser, MD;  Location: [facility];  Service:  Urology;  Laterality: N/A;  . Vaginal hysterectomy N/A 10/19/2015    Procedure: HYSTERECTOMY VAGINAL;  Surgeon: Servando Salina, MD;  Location: [facility];  Service: Gynecology;  Laterality: N/A;  . Salpingoophorectomy Bilateral 10/19/2015    Procedure: BILATERAL SALPINGO OOPHORECTOMY;  Surgeon: Servando Salina, MD;  Location: [facility];  Service: Gynecology;  Laterality: Bilateral;    FAMILY HISTORY: Family History  Problem Relation Age of Onset  . Parkinson's disease Father   . Pancreatitis Maternal Grandmother   . Diabetes Maternal Grandfather   . Heart attack Maternal Grandfather   . Stomach cancer Paternal Grandfather   . Dementia Paternal Grandmother     SOCIAL HISTORY:  Social History   Social History  . Marital Status: Widowed    Spouse Name: N/A  . Number of Children: 1  . Years of Education: Masters   Occupational History  . Retired    Social History Main Topics  . Smoking status: Former Smoker    Types: Cigarettes    Quit date: 10/11/1988  . Smokeless tobacco: Never Used     Comment: Quit 1988  . Alcohol Use: 0.0 oz/week    0 Standard drinks or equivalent per week     Comment: Social  . Drug Use: No  . Sexual Activity: Not on file   Other Topics Concern  . Not on file   Social History Narrative   Lives at home alone.   Right-handed.   2-4 cups per week.     PHYSICAL EXAM   Filed Vitals:   12/27/15 1136  BP: 121/72  Pulse: 63  Height: 5\' 2"  (1.575 m)  Weight: 131 lb (59.421 kg)    Not recorded      Body mass index is 23.95 kg/(m^2).  PHYSICAL EXAMNIATION:  Gen: NAD, conversant, well nourised, obese, well groomed                     Cardiovascular: Regular rate rhythm, no peripheral edema, warm, nontender. Eyes: Conjunctivae clear without exudates or hemorrhage Neck: Supple, no carotid bruise. Pulmonary: Clear to auscultation bilaterally   NEUROLOGICAL EXAM:  MENTAL STATUS: Speech:    Speech is normal; fluent and spontaneous with  normal comprehension.  Cognition: Mini-Mental Status Examination 30 out of 30, animal naming is 17     Orientation to time, place and person     Normal recent and remote memory     Normal Attention span and concentration     Normal Language, naming, repeating,spontaneous speech     Fund of knowledge   CRANIAL NERVES: CN II: Visual fields are full to confrontation. Pupils are round equal and briskly reactive to light. CN III, IV, VI: extraocular movement are normal. No ptosis. CN V: Facial sensation is intact to pinprick in all 3 divisions bilaterally. Corneal responses are intact.  CN VII: Face is symmetric with normal eye closure and smile. CN VIII: Hearing is normal to rubbing fingers CN  IX, X: Palate elevates symmetrically. Phonation is normal. CN XI: Head turning and shoulder shrug are intact CN XII: Tongue is midline with normal movements and no atrophy.  MOTOR: There is no pronator drift of out-stretched arms. Muscle bulk and tone are normal. Muscle strength is normal.  REFLEXES: Reflexes are 2+ and symmetric at the biceps, triceps, knees, and ankles. Plantar responses are flexor.  SENSORY: Intact to light touch, pinprick, position sense, and vibration sense are intact in fingers and toes.  COORDINATION: Rapid alternating movements and fine finger movements are intact. There is no dysmetria on finger-to-nose and heel-knee-shin.    GAIT/STANCE: Posture is normal. Gait is steady with normal steps, base, arm swing, and turning. Heel and toe walking are normal. Tandem gait is normal.  Romberg is absent.   DIAGNOSTIC DATA (LABS, IMAGING, TESTING) - I reviewed patient records, labs, notes, testing and imaging myself where available.   ASSESSMENT AND PLAN  Amanda Contreras is a 70 y.o. female   Mild cognitive impairment  Refer her to neuropsychiatric evaluation  Started Namenda 10 mg twice a day  Continue moderate exercise  Marcial Pacas, M.D. Ph.D.  [facility] [address] [place], [place] [address] Ph: [phone] Fax: [phone]  CC: Leighton Ruff, MD

## 2015-12-29 ENCOUNTER — Telehealth: Payer: Self-pay

## 2015-12-29 NOTE — Telephone Encounter (Signed)
I spoke to the patient's cousin, Darl Householder, in regards to the CREAD study. [name] stated that the patient is no longer interested in participating. I thanked her for her time and consideration.

## 2016-02-14 ENCOUNTER — Other Ambulatory Visit: Payer: Self-pay | Admitting: Neurology

## 2016-03-22 ENCOUNTER — Ambulatory Visit: Payer: Medicare Other | Attending: Psychology | Admitting: Psychology

## 2016-03-22 DIAGNOSIS — G3184 Mild cognitive impairment, so stated: Secondary | ICD-10-CM | POA: Diagnosis not present

## 2016-03-23 ENCOUNTER — Encounter: Payer: Self-pay | Admitting: Psychology

## 2016-03-23 NOTE — Progress Notes (Signed)
[facility]  [address]   Telephone [phone] [address] Fax [phone] [place], [place] [address]  Initial Contact Note  Name:  Amanda Contreras Date of Birth; November 14, 1946 MRN:  [id number] Date:  03/23/2016  Amanda Contreras is an 70 y.o. female who was referred for neuropsychological evaluation by Marcial Pacas, MD due to recent problems with memory.   A total of 6 hours was spent today reviewing medical records, interviewing (CPT [phone]) [name] and her cousins, and administering and scoring neurocognitive tests (CPT [phone] & [phone]).  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   A full report will follow once the planned testing has been completed. Her next appointment is scheduled for 03/28/16.   Jamey Ripa, Ph.D Licensed Psychologist 03/23/2016

## 2016-03-28 ENCOUNTER — Ambulatory Visit (INDEPENDENT_AMBULATORY_CARE_PROVIDER_SITE_OTHER): Payer: Medicare Other | Admitting: Psychology

## 2016-03-28 DIAGNOSIS — G3184 Mild cognitive impairment, so stated: Secondary | ICD-10-CM

## 2016-03-29 ENCOUNTER — Encounter: Payer: Self-pay | Admitting: Psychology

## 2016-03-29 NOTE — Progress Notes (Signed)
[facility]  [address]   Telephone [phone] [address] Fax [phone] [place], [place] [address]   NEUROPSYCHOLOGICAL EVALUATION  *CONFIDENTIAL* This report should not be released without the consent of the client  Name:   Amanda Contreras  Date of Birth:  December 16, 2045 [facility] MR#:  [id number] Date of Evaluation: 03/22/16  Reason for Referral Amanda Contreras is a 70 year old right-handed woman who was referred for neuropsychological evaluation by Marcial Pacas, MD of [facility]. [name] has demonstrated memory difficulties over the past two years. A brain MRI scan on 12/27/11 showed mild scattered periventricular and subcortical hyperintensities that were slightly greater than expected for her age without acute abnormalities. She was started on Namenda in October 2016.  Sources of Information Electronic medical records from the [facility] were reviewed. [name] and two cousins, [name] and [name], were interviewed.   Chief Complaints Ms. Hiraldo acknowledged having had memory problems over the past two years though stated that she considers these problems as less serious than her family members do. She gave examples of having a harder time finding words, being less efficient in finding personal items in her home, struggling more to multi-task, leaving doors to kitchen cabinets or her refrigerator open after being distracted by her dog and needing to think longer about where she is going while driving. She did not report any problems performing her basic activities of daily living.   Other problems cited included difficulty falling asleep most nights (without daytime sleepiness), a partly unintentional weight loss of about fifty pounds over the past two years and intermittent diplopia since 2012/03/18. She denied problems with eye-hand coordination, balance, limb strength, swallowing, speech, smell or taste.   With  regards to her mood, she reported having experienced brief periods of low mood that she primarily associated with grief over the death of her husband in 2011-03-19 and her mother in 65. She also cited a sense of diminished meaning and purpose in life as she has gotten older. During these episodes, which have lasted minutes to a few hours, she has been prone to easily cry and feel guilty about actions not taken. She denied experiencing persisting sad mood, apathy, anhedonia, loss of interests, feelings of worthlessness or hopelessness, passive or active suicidal thoughts, mania, hallucinations and delusions. She did not report any ongoing stressors or recent negative life events.  Her cousins reported that Ms. Stump began to display signs of cognitive decline soon after her mother's death in 03-18-14.They gave recent examples of her repeating herself, forgetting details of conversations, having trouble finding words and repeating routine activities (e.g., feeling the dog twice within thirty minutes). They described her as more easily moved to tears when frustrated over the past year but otherwise she has not displayed any changes in her mood, personality or habits. They have not observed her to have exhibited confusion, wandering, bizarre behavior, loss of social comportment or unsafe behavior.  Background  She has been living alone since the death of her third husband in Mar 19, 2011. She had been previously divorced twice. She has an adult daughter and four stepdaughters.    [name] had been her mother's primary caregiver after she suffered a stroke in Mar 19, 2011 (a few weeks after [name]'s husband died) until her mother's death in 89.  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  She reported that she earned a Conservator, museum/gallery in [employer] and a [employer] in [place]. She did  not report any history of school-based problems with attention or learning.  Her past medical history was notable for arthritis, heart murmur and hypercholesteremia.  She did not report history of unusual childhood illnesses or developmental delays. She reported no history of head injury, loss of consciousness, seizure activity, stroke-like symptoms or exposure to toxic chemicals.   She reported rare alcohol use without past history of abuse. She reported that she has never used illicit drugs.  She is a former cigarette smoker who quit in 1989,  She reported that her paternal grandmother suffered from dementia.  She reported no history of serious emotional difficulties, use of psychiatric medications or mental health contacts.  The only medication she takes is memantine.  Observations She appeared as an appropriately dressed and groomed woman in no apparent distress. She interacted in a pleasant and cooperative manner. She spoke in a normal tone of voice, maintained good eye contact and responded to all questions. No problems were evident for speech articulation, prosody, word finding, word selection, message coherence or language comprehension. Her affect appeared within a wide range and without lability. She did not display signs of emotional distress. She was oriented in all spheres. She seemed to have difficulty recalling when in time past life events had occurred. She tended to repeat the same questions. Her thought processes were coherent and organized without loose associations, verbal perseverations or flight of ideas. Her thought content was devoid of unusual or bizarre ideas.  Evaluation Procedures Animal Naming Test  Beck Depression Inventory-II Boston Naming Test Controlled Oral Word Association Test Geriatric Depression Scale (short form) Modified Wisconsin Card Sorting Test Rey Complex Figure: Copy Stroop Color & Word Test Trail Making A & B Wechsler Adult Intelligence  Scale-IV: Music therapist, Coding, Digit Span & Similarities  Wechsler Memory Scale-IV: Older Adult Battery Wide Range Achievement Test-4: Word Reading  Assessment Results Test Validity & Interpretive Considerations It was concluded that the test results represented a valid measure of her current cognitive functioning. She did not display signs of physical or emotional distress. She did not report or display problems with vision (she wore her eyeglasses), hearing or motor skills. She was able to comprehend task instructions without difficulty. She appeared to sustain attention and persist to task. She appeared to expend maximal effort.   Her pre-morbid intellectual potential was estimated to fall within the High Average range based on her educational background (i.e., Master's degree) coupled with a measure of word reading (Wide Range Achievement Test-4), which is usually resistant to the effects of neurological and psychiatric disorder.  Her test scores were corrected to reflect norms for her age and, whenever possible, her gender and educational level (i.e., 18 years). A listing of test results can be found at the end of this report.  Speed of Processing & Attention Her speed to transcribe symbols to match digits using a key (Wechsler Adult Intelligence Scale-IV (WAIS-IV) Coding) or to draw lines to connect randomly arrayed numbers in sequence (Trails A) fell within the Average range.   Her attentional capacity to encode, hold and manipulate information in temporary memory was within normal expectations on tests that required mentally rearranging digits in reverse or ascending order (WAIS-IV Digit Span) or immediately recognizing symbols in left to right order (Wechsler Memory Scale-IV (WMS-IV) Symbol Span).   Executive Function For the most part,  she performed within normal limits on measures of executive function, which comprise higher level attentional, organizational and reasoning processes that  enable a person to successfully initiate, monitor, shift, plan and execute complex behavior. The one possible exception was her defective drawing a complex geometric design (Rey Complex Figure), which might suggest reduced ability to organize and plan novel input. Otherwise, she performed within normal expectations on tests that measured mental tracking speed and set shifting (Trails B), the ability to selectively allocate attention and inhibit an automatic response by naming the print color of a word while simultaneously ignoring the conflicting word (Stroop Color & Word Test), fluent generation of words from category or letter prompts (Animal Naming Test; Controlled Oral Word Association Test), abstract verbal reasoning (WAIS-IV Similarities) and nonverbal problem-solving dependent on inferential reasoning and conceptual flexibility (Modified [employer]).  Learning & Memory A composite measure of her ability to immediately recall verbal and visual information (WMS-IV Immediate Memory Index) was lower than expected at lower boundary of the Average range. She showed a relative weakness for learning word pairs via association, which was information that lacked an inherently organizing structure. In contrast, her immediate recall of stories read to her or figural drawings fell within the Average range. A composite measure of her delayed recall of verbal and visual information (WMS-IV Delayed Memory Index) fell within the impaired range at the 1st percentile. Her delayed memory was primarily depressed by forgetting of visual designs as she was unable to freely recall any aspects of the visual designs despite relatively strong initial learning. Her ability to recognize information in response to prompts or multiple choices not only exceeded her free delayed recall scores but was within the normal range on all subtests, which indicated that she mainly had difficulties retrieving information from  memory storage.  Language Her performance on a measure of confrontational naming [facility] Naming Test) was lower than expected within the Low Average range. Her phonemic fluency, based on her ability to generate words to designated letters (Controlled Oral Word Association Test), was within the Superior range. Likewise, her ability to read words (Wide Range Achievement Test-4: Word Reading) fell within the Superior range.  Visual-Spatial Perception & Organization There were no signs of spatial inattention or problems with visual recognition. Her score on a test of visuospatial organization that required assembly of two-dimensional block designs from models (WAIS-IV Block Design) fell at the lower boundary of the Average range. Her copy of a spatially complex geometric figure (Rey Complex Figure) was borderline impaired due to faulty organization.    Emotional Status Her score of 1/15 on the Geriatric Depression Scale and 8 on the Beck Depression Inventory-II both fell within the minimal or non-depressed range.    Summary & Conclusions Jamyiah Veenstra is a 70 year old woman with an approximate two year history of memory difficulties.  Observations of this alert and pleasant woman were notable for her tendency to repeatedly ask the same question.  Neuropsychological testing was notable for an overall mild impairment of memory. Specifically she demonstrated seriously limited ability to learn verbal information that lacked an inherently organizing structure, such as word pairs. In contrast, her immediate recall of conceptually organized and semantically related information, such as a story, was normal. After a delay she demonstrated impaired recall of visual information initially learned. Both her verbal and visual delayed memory performances significantly improved in response to prompts or multiple choices, which indicated that she was able to store information in memory but could  not effectively retrieve  it. Her non-memory cognitive abilities were generally within normal expectations though she had difficulty copying a spatially complex design and scored lower than expected within the Low Average range on a test of confrontational naming. She performed mostly to expectation on measures of speed of processing, working memory span, cognitive flexibility and conceptual reasoning.  With regards to her emotional functioning, she reported experiencing periodic, transient sad mood that she associated with grief over family deaths that occurred in 2012 and 2015. Otherwise she did not report symptoms consistent with a psychological disorder.    In conclusion, her neuropsychological profile considered together with the description of her everyday functioning would be consistent with Mild Cognitive Impairment, primarily amnestic type. Her relatively intact attentional and executive functioning coupled with her insight into her memory difficulties would bode well for her potential to maintain independent living.   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   Marland Kitchen She was encouraged to maintain her participation in physical, leisure and social activities. The benefits of these activities as possible means to enhance her sense of wellbeing and forestall further cognitive decline were discussed.   Marland Kitchen She was advised to use memory compensatory strategies, such as a written daily schedule, a portable audio recorder, phone alarms, task check-off lists (e.g., when the dog was last fed) and a centrally located bulletin board.   . Given that her memory primarily breaks down at the stage of retrieval, she might benefit from use of retrieval cues to facilitate access to stored information. These cues might consist of situational cues, visual images or other words that the new  information can be associated with upon learning that can act as a "hook" on which to hang new knowledge or experiences.  . She was advised to arrange for as many of her bills as possible to be paid automatically. She agreed to allow a family member to provide oversight of her bill paying.   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    It is recommended that she undergo a repeat neuropsychological evaluation in about one year or so using these test results as a baseline to track for interval changes, if any, in her cognitive functioning.    I have appreciated the opportunity to evaluate Ms. Fierro. Please feel free to contact me with any comments or questions.    ______________________ Jamey Ripa, Ph.D Licensed Psychologist    ADDENDUM-NEUROPSYCHOLOGICAL TEST RESULTS Animal Naming Test Score= 19 37th (adjusted for age, gender and educational level)   Boston Naming Test Score= 53/60 14th (adjusted for age, gender and educational level)   Controlled Oral Word Association Test Score= 63 words/0 repetitions 95th (adjusted for age, gender and educational level)   Modified Wisconsin Card Sorting Test  Categories correct       6 73rd (adjusted for age and education)  Perseverative errors      1 54th             Total errors       4 66th        Executive function composite   106 66th         Rey Complex Figure: copy  Score= 25.5/36 6th - 10th     Stroop Color & Word Test  Score Residual % (adjusted for age and educational level)  Word 106 -2 47th         Color   64 -13 14th        Color-Word   35 -6 28th        Trails A Score= 36  0e    34th (adjusted for age, gender and educational level)  Trails B Score= 95s  0e      25th (adjusted for age, gender and educational level)   Wechsler Adult Intelligence Scale-IV   Subtest Scaled Score  Percentile  Block Design    8   25th     Similarities  14   91st      Digit Span   Forward   Backward   Sequencing  12   10  14  11    75th      50th    91st     63rd      Coding    12   75th       Wechsler Memory Scale-IV Older Adult Battery Index Index Score Percentile  Immediate Memory  90 25th       Auditory Memory  80   9th      Visual Memory  82 12th       Delayed Memory  67   1st       Symbol Span subtest Scaled score= 11 63rd        Wide Range Achievement Test-4 Subtest  Raw score Standard score Percentile  Word Reading 68/70 126 96th

## 2016-04-03 ENCOUNTER — Telehealth: Payer: Self-pay | Admitting: Neurology

## 2016-04-03 ENCOUNTER — Encounter: Payer: Self-pay | Admitting: Psychology

## 2016-04-03 NOTE — Telephone Encounter (Signed)
Spoke to McDonald at the pharmacy - the medication does not require a PA - the pharmacy needs to call the help desk for an early refill override.  They will take care of this today.  I instructed them to call us back if they have additional problems.  Also called patient to let her know the status of this problem.

## 2016-04-03 NOTE — Telephone Encounter (Signed)
Patient would like a call back to let her know if authorization was approved or denied.

## 2016-04-03 NOTE — Telephone Encounter (Signed)
Patient called regarding memantine (NAMENDA) 10 MG tablet, states she lost bottle of medication and [employer] will need PA in order to cover this 2nd Rx. Please call [employer] prior auth# [id number].

## 2016-06-26 ENCOUNTER — Ambulatory Visit: Payer: Medicare Other | Admitting: Neurology

## 2016-07-25 ENCOUNTER — Ambulatory Visit: Payer: Medicare Other | Admitting: Neurology

## 2016-08-31 ENCOUNTER — Ambulatory Visit (INDEPENDENT_AMBULATORY_CARE_PROVIDER_SITE_OTHER): Payer: Medicare Other | Admitting: Neurology

## 2016-08-31 ENCOUNTER — Encounter: Payer: Self-pay | Admitting: Neurology

## 2016-08-31 VITALS — BP 127/75 | HR 56 | Ht 62.0 in | Wt 132.0 lb

## 2016-08-31 DIAGNOSIS — R4189 Other symptoms and signs involving cognitive functions and awareness: Secondary | ICD-10-CM

## 2016-08-31 MED ORDER — MEMANTINE HCL-DONEPEZIL HCL ER 28-10 MG PO CP24: 1.0000 | ORAL_CAPSULE | Freq: Every day | ORAL | 11 refills | Status: DC

## 2016-08-31 NOTE — Progress Notes (Signed)
Chief Complaint  Patient presents with  . Memory Loss    MMSE 29/30 - 18 animals.  She is here with her daughter [name], cousin - [name] and cousin - [name]. She is having more difficulty with recall and word finding. Feels anxiety and agitation have been more problematic. She saw Dr. Valentina Shaggy in 03/2016 for a neuropsychiatic evaluation and would like to review his findings.      PATIENT: Amanda Contreras DOB: Nov 04, 1946  Chief Complaint  Patient presents with  . Memory Loss    MMSE 29/30 - 18 animals.  She is here with her daughter [name], cousin - [name] and cousin - [name]. She is having more difficulty with recall and word finding. Feels anxiety and agitation have been more problematic. She saw Dr. Valentina Shaggy in 03/2016 for a neuropsychiatic evaluation and would like to review his findings.     HISTORICAL  Amanda Contreras is a 70 years old right-handed female, seen in refer by  primary care physician [name], MD in September 23 2015 for evaluation of memory loss, she is accompanied by her cousin [name] at today's clinical visit.  She has past medical history of sensory neuronopathy hearing loss since 2012, hyperlipidemia, previous smoker, quit in 1989,  She had masses degree, majored in [place] education, was a retired Pharmacist, hospital at age 11, she was widowed since 2012, also took care of her mother afterwards, who passed away in Spring of 2015.  I saw her previously for double vision in 2013, which has resolved, I personally reviewed MRI of the brain January 2013, mild small vessel disease, generalized atrophy.  Since spring of 2015, she was noted to have word finding difficulties, sometimes driving familiar route, she has to think hard to get to the destination, once she was lost in her urologist office. Still managing her own bill, lives with her dog at her household, forgetting appointments sometimes, she also complains of difficulty falling to sleep, not eating regularly, she is also on  diet, lost over 30 pounds since 2015.  Her paternal grandmother suffered dementia.  Update December 27 2015: She was started on Namenda 10 mg twice a day since October 2016, She tolerates it well. She still has mild memory loss, she lives by her self, she still drives, " have to think where I am going",  She has GPS, she has trouble keep things organized,  She eats well, mild insomnia, tends to sleep late.    She is seeing an elder care attorney, financial consultation, which has added on pressure to her.  UPDATE Sept 28th 2017: She is accompanied by her daughter and 2 cousins at today's clinical visit  we have evaluate her neuropsychological evaluation by Dr. [name]," profile considered together with the description of her everyday functioning would be consistent with Mild Cognitive Impairment, primarily amnestic type. Her relatively intact attentional and executive functioning coupled with her insight into her memory difficulties would bode well for her potential to maintain independent living"  She continue complains of progressive worsening memory loss, her daughter has made some arrangements to clean out her house, potential transition into  friend's home assistant living in 4 years,   Her father suffered Parkinson's disease, memory loss, paternal grandmother has memory loss.  REVIEW OF SYSTEMS: Full 14 system review of systems performed and notable only for appetite change, double vision, blurry vision, memory loss, agitations  ALLERGIES: Allergies  Allergen Reactions  . Hydrocodone Other (See Comments)    dizziness  .  Latex Rash  . Sulfa Antibiotics Rash    HOME MEDICATIONS: Current Outpatient Prescriptions  Medication Sig Dispense Refill  . Calcium-Phosphorus-Vitamin D (CITRACAL +D3 PO) Take 2 tablets by mouth 2 (two) times daily.    . memantine (NAMENDA) 10 MG tablet TAKE 1 TABLET (10 MG TOTAL) BY MOUTH 2 (TWO) TIMES DAILY. 60 tablet 11  . Multiple Vitamins-Minerals  (PRESERVISION AREDS 2 PO) Take 1 tablet by mouth 2 (two) times daily.      No current facility-administered medications for this visit.     PAST MEDICAL HISTORY: Past Medical History:  Diagnosis Date  . Arthritis   . Genital prolapse   . Heart murmur   . Hypercholesteremia     PAST SURGICAL HISTORY: Past Surgical History:  Procedure Laterality Date  . ANTERIOR AND POSTERIOR REPAIR N/A 10/19/2015   Procedure: ANTERIOR (CYSTOCELE) ;  Surgeon: Bjorn Loser, MD;  Location: [facility];  Service: Urology;  Laterality: N/A;  . CYSTOSCOPY N/A 10/19/2015   Procedure: CYSTOSCOPY;  Surgeon: Bjorn Loser, MD;  Location: [facility];  Service: Urology;  Laterality: N/A;  . DILATATION & CURETTAGE/HYSTEROSCOPY WITH MYOSURE N/A 09/02/2015   Procedure: DILATATION & CURETTAGE/HYSTEROSCOPY WITH MYOSURE;  Surgeon: Servando Salina, MD;  Location: [facility];  Service: Gynecology;  Laterality: N/A;  . PUBOVAGINAL SLING N/A 10/19/2015   Procedure: Inetta Fermo  ;  Surgeon: Bjorn Loser, MD;  Location: [facility];  Service: Urology;  Laterality: N/A;  . SALPINGOOPHORECTOMY Bilateral 10/19/2015   Procedure: BILATERAL SALPINGO OOPHORECTOMY;  Surgeon: Servando Salina, MD;  Location: [facility];  Service: Gynecology;  Laterality: Bilateral;  . Uterine/Bladder Prolapse Surgery     Scheduled 10/19/15  . VAGINAL HYSTERECTOMY N/A 10/19/2015   Procedure: HYSTERECTOMY VAGINAL;  Surgeon: Servando Salina, MD;  Location: [facility];  Service: Gynecology;  Laterality: N/A;    FAMILY HISTORY: Family History  Problem Relation Age of Onset  . Parkinson's disease Father   . Pancreatitis Maternal Grandmother   . Diabetes Maternal Grandfather   . Heart attack Maternal Grandfather   . Stomach cancer Paternal Grandfather   . Dementia Paternal Grandmother     SOCIAL HISTORY:  Social History   Social History  . Marital status: Widowed    Spouse name: N/A  . Number of children: 1  . Years of education: Masters    Occupational History  . Retired    Social History Main Topics  . Smoking status: Former Smoker    Types: Cigarettes    Quit date: 10/11/1988  . Smokeless tobacco: Never Used     Comment: Quit 1988  . Alcohol use 0.0 oz/week     Comment: Social  . Drug use: No  . Sexual activity: Not on file   Other Topics Concern  . Not on file   Social History Narrative   Lives at home alone.   Right-handed.   2-4 cups per week.     PHYSICAL EXAM   Vitals:   08/31/16 1132  BP: 127/75  Pulse: (!) 56  Weight: 132 lb (59.9 kg)  Height: 5\' 2"  (1.575 m)    Not recorded      Body mass index is 24.14 kg/m.  PHYSICAL EXAMNIATION:  Gen: NAD, conversant, well nourised, obese, well groomed                     Cardiovascular: Regular rate rhythm, no peripheral edema, warm, nontender. Eyes: Conjunctivae clear without exudates or hemorrhage Neck: Supple, no carotid bruise. Pulmonary: Clear to auscultation  bilaterally   NEUROLOGICAL EXAM:  MENTAL STATUS: Speech:    Speech is normal; fluent and spontaneous with normal comprehension.  Cognition: Mini-Mental Status Examination 29 out of 30, animal naming is 18     Orientation: she is not oriented to date     Normal recent and remote memory     Normal Attention span and concentration     Normal Language, naming, repeating,spontaneous speech     Fund of knowledge   CRANIAL NERVES: CN II: Visual fields are full to confrontation. Pupils are round equal and briskly reactive to light. CN III, IV, VI: extraocular movement are normal. No ptosis. CN V: Facial sensation is intact to pinprick in all 3 divisions bilaterally. Corneal responses are intact.  CN VII: Face is symmetric with normal eye closure and smile. CN VIII: Hearing is normal to rubbing fingers CN IX, X: Palate elevates symmetrically. Phonation is normal. CN XI: Head turning and shoulder shrug are intact CN XII: Tongue is midline with normal movements and no  atrophy.  MOTOR: There is no pronator drift of out-stretched arms. Muscle bulk and tone are normal. Muscle strength is normal.  REFLEXES: Reflexes are 2+ and symmetric at the biceps, triceps, knees, and ankles. Plantar responses are flexor.  SENSORY: Intact to light touch, pinprick, position sense, and vibration sense are intact in fingers and toes.  COORDINATION: Rapid alternating movements and fine finger movements are intact. There is no dysmetria on finger-to-nose and heel-knee-shin.    GAIT/STANCE: Posture is normal. Gait is steady with normal steps, base, arm swing, and turning. Heel and toe walking are normal. Tandem gait is normal.  Romberg is absent.   DIAGNOSTIC DATA (LABS, IMAGING, TESTING) - I reviewed patient records, labs, notes, testing and imaging myself where available.   ASSESSMENT AND PLAN  Amanda Contreras is a 70 y.o. 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  Face to face time was 40  minutes, greater than 50% of the time was spent in counseling and coordination of care with the patient  Marcial Pacas, M.D. Ph.D.  [facility] [address], [place], [place] [address] Ph: [phone] Fax: [phone]  CC: Leighton Ruff, MD

## 2016-10-04 ENCOUNTER — Telehealth: Payer: Self-pay | Admitting: Neurology

## 2016-10-04 NOTE — Telephone Encounter (Signed)
Patient called to advise, she has looked up the side effects of the medication Dr. Krista Blue is changing her to Memantine HCl-Donepezil HCl (NAMZARIC) 28-10 MG CP24, doesn't like the side effects and doesn't want to take, would prefer to go back to memantine (NAMENDA) 10 MG tablet.

## 2016-10-04 NOTE — Telephone Encounter (Signed)
Left message for return call.

## 2016-10-05 ENCOUNTER — Other Ambulatory Visit: Payer: Self-pay | Admitting: *Deleted

## 2016-10-05 MED ORDER — MEMANTINE HCL 10 MG PO TABS: ORAL_TABLET | ORAL | 11 refills | Status: DC

## 2016-10-05 NOTE — Telephone Encounter (Signed)
Dr. Krista Blue has reviewed - ok to continue Namenda 10mg , BID - refills sent to pharmacy - Namzaric canceled.  Pt aware.

## 2016-10-05 NOTE — Telephone Encounter (Signed)
Pt returned Michelle's call. She said to let RN know " I got your message and understand. Thank you very much".

## 2016-10-18 ENCOUNTER — Other Ambulatory Visit: Payer: Self-pay | Admitting: Neurology

## 2017-02-26 ENCOUNTER — Other Ambulatory Visit: Payer: Self-pay | Admitting: *Deleted

## 2017-02-26 MED ORDER — MEMANTINE HCL 10 MG PO TABS: ORAL_TABLET | ORAL | 3 refills | Status: DC

## 2017-02-28 ENCOUNTER — Ambulatory Visit: Payer: Medicare Other | Admitting: Neurology

## 2017-03-15 ENCOUNTER — Ambulatory Visit: Payer: Medicare Other | Admitting: Neurology

## 2017-03-28 ENCOUNTER — Ambulatory Visit: Payer: Medicare Other | Admitting: Neurology

## 2017-03-29 ENCOUNTER — Encounter: Payer: Self-pay | Admitting: Neurology

## 2017-03-29 ENCOUNTER — Ambulatory Visit (INDEPENDENT_AMBULATORY_CARE_PROVIDER_SITE_OTHER): Payer: Medicare Other | Admitting: Neurology

## 2017-03-29 VITALS — BP 124/76 | HR 64 | Ht 62.0 in | Wt 134.0 lb

## 2017-03-29 DIAGNOSIS — R4189 Other symptoms and signs involving cognitive functions and awareness: Secondary | ICD-10-CM | POA: Diagnosis not present

## 2017-03-29 MED ORDER — MEMANTINE HCL-DONEPEZIL HCL ER 28-10 MG PO CP24: 1.0000 | ORAL_CAPSULE | Freq: Every day | ORAL | 11 refills | Status: DC

## 2017-03-29 MED ORDER — DONEPEZIL HCL 10 MG PO TABS: 10.0000 mg | ORAL_TABLET | Freq: Every day | ORAL | 11 refills | Status: DC

## 2017-03-29 NOTE — Progress Notes (Signed)
PATIENT: Amanda Contreras DOB: Feb 12, 1946  Chief Complaint  Patient presents with  . Memory Loss    MMSE 29/30 - 16 animals.  She is here with her daughter [name], cousin - [name] and cousin - [name]. No new concerns today.  Feels she is doing well on Namenda.  She decided again trying Namzaric due to the listed, potential side effects.     HISTORICAL  Amanda Contreras is a 71 years old right-handed female, seen in refer by  primary care physician [name], MD in September 23 2015 for evaluation of memory loss, she is accompanied by her cousin [name] at today's clinical visit.Initial evaluation was in October 2016.  She has past medical history of sensory neuronopathy hearing loss since 2012, hyperlipidemia, previous smoker, quit in 1989,  She had masses degree, majored in [place] education, was a retired Pharmacist, hospital at age 89, she was widowed since 2012, also took care of her mother afterwards, who passed away in Spring of 2015.  I saw her previously for double vision in 2013, which has resolved, I personally reviewed MRI of the brain January 2013, mild small vessel disease, generalized atrophy.  Since spring of 2015, she was noted to have word finding difficulties, sometimes driving familiar route, she has to think hard to get to the destination, once she was lost in her urologist office. Still managing her own bill, lives with her dog at her household, forgetting appointments sometimes, she also complains of difficulty falling to sleep, not eating regularly, she is also on diet, lost over 30 pounds since 2015.  Her paternal grandmother suffered dementia.  Update December 27 2015: She was started on Namenda 10 mg twice a day since October 2016, She tolerates it well. She still has mild memory loss, she lives by her self, she still drives, " have to think where I am going",  She has GPS, she has trouble keep things organized,  She eats well, mild insomnia, tends to sleep late.     She is seeing an elder care attorney, financial consultation, which has added on pressure to her.  UPDATE Sept 28th 2017: She is accompanied by her daughter and 2 cousins at today's clinical visit  we have evaluate her neuropsychological evaluation by Dr. [name]," profile considered together with the description of her everyday functioning would be consistent with Mild Cognitive Impairment, primarily amnestic type. Her relatively intact attentional and executive functioning coupled with her insight into her memory difficulties would bode well for her potential to maintain independent living"  She continue complains of progressive worsening memory loss, her daughter has made some arrangements to clean out her house, potential transition into  friend's home assistant living in 4 years,   Her father suffered Parkinson's disease, memory loss, paternal grandmother has memory loss.  UPDATE March 29 2017: She is with her daughter, and 2 cousins [name] and [name] at today's clinical visit, she is noted to be more anxious, increased difficulty on daily activity, has to be reminded about taking her medications, also has difficulty staying alone at evening time, make phone calls to her cousins in the middle of the night  She is taking Namenda 10 mg twice a day, did not try Aricept worry about the side effects, [facility] also [facility]  REVIEW OF SYSTEMS: Full 14 system review of systems performed and notable only for double vision, loss of vision, insomnia, environmental allergy, dizziness, decreased  concentration, depression, anxiety  ALLERGIES: Allergies  Allergen Reactions  . Hydrocodone Other (See Comments)    dizziness  . Latex Rash  . Sulfa Antibiotics Rash    HOME MEDICATIONS: Current Outpatient Prescriptions  Medication Sig Dispense Refill  . Calcium-Phosphorus-Vitamin D (CITRACAL +D3 PO) Take 2 tablets by mouth 2 (two) times daily.    .  memantine (NAMENDA) 10 MG tablet TAKE 1 TABLET (10 MG TOTAL) BY MOUTH 2 (TWO) TIMES DAILY. 180 tablet 3  . Multiple Vitamins-Minerals (PRESERVISION AREDS 2 PO) Take 1 tablet by mouth 2 (two) times daily.      No current facility-administered medications for this visit.     PAST MEDICAL HISTORY: Past Medical History:  Diagnosis Date  . Arthritis   . Genital prolapse   . Heart murmur   . Hypercholesteremia     PAST SURGICAL HISTORY: Past Surgical History:  Procedure Laterality Date  . ANTERIOR AND POSTERIOR REPAIR N/A 10/19/2015   Procedure: ANTERIOR (CYSTOCELE) ;  Surgeon: Bjorn Loser, MD;  Location: [facility];  Service: Urology;  Laterality: N/A;  . CYSTOSCOPY N/A 10/19/2015   Procedure: CYSTOSCOPY;  Surgeon: Bjorn Loser, MD;  Location: [facility];  Service: Urology;  Laterality: N/A;  . DILATATION & CURETTAGE/HYSTEROSCOPY WITH MYOSURE N/A 09/02/2015   Procedure: DILATATION & CURETTAGE/HYSTEROSCOPY WITH MYOSURE;  Surgeon: Servando Salina, MD;  Location: [facility];  Service: Gynecology;  Laterality: N/A;  . PUBOVAGINAL SLING N/A 10/19/2015   Procedure: Inetta Fermo  ;  Surgeon: Bjorn Loser, MD;  Location: [facility];  Service: Urology;  Laterality: N/A;  . SALPINGOOPHORECTOMY Bilateral 10/19/2015   Procedure: BILATERAL SALPINGO OOPHORECTOMY;  Surgeon: Servando Salina, MD;  Location: [facility];  Service: Gynecology;  Laterality: Bilateral;  . Uterine/Bladder Prolapse Surgery     Scheduled 10/19/15  . VAGINAL HYSTERECTOMY N/A 10/19/2015   Procedure: HYSTERECTOMY VAGINAL;  Surgeon: Servando Salina, MD;  Location: [facility];  Service: Gynecology;  Laterality: N/A;    FAMILY HISTORY: Family History  Problem Relation Age of Onset  . Parkinson's disease Father   . Pancreatitis Maternal Grandmother   . Diabetes Maternal Grandfather   . Heart attack Maternal Grandfather   . Stomach cancer Paternal Grandfather   . Dementia Paternal Grandmother     SOCIAL HISTORY:  Social History    Social History  . Marital status: Widowed    Spouse name: N/A  . Number of children: 1  . Years of education: Masters   Occupational History  . Retired    Social History Main Topics  . Smoking status: Former Smoker    Types: Cigarettes    Quit date: 10/11/1988  . Smokeless tobacco: Never Used     Comment: Quit 1988  . Alcohol use 0.0 oz/week     Comment: Social  . Drug use: No  . Sexual activity: Not on file   Other Topics Concern  . Not on file   Social History Narrative   Lives at home alone.   Right-handed.   2-4 cups per week.     PHYSICAL EXAM   Vitals:   03/29/17 1153  BP: 124/76  Pulse: 64  Weight: 134 lb (60.8 kg)  Height: 5\' 2"  (1.575 m)    Not recorded      Body mass index is 24.51 kg/m.  PHYSICAL EXAMNIATION:  Gen: NAD, conversant, well nourised, obese, well groomed                     Cardiovascular: Regular rate rhythm, no  peripheral edema, warm, nontender. Eyes: Conjunctivae clear without exudates or hemorrhage Neck: Supple, no carotid bruise. Pulmonary: Clear to auscultation bilaterally   NEUROLOGICAL EXAM:  MENTAL STATUS: MMSE - Mini Mental State Exam 03/29/2017 08/31/2016 12/27/2015  Orientation to time 4 4 5   Orientation to Place 5 5 5   Registration 3 3 3   Attention/ Calculation 5 5 5   Recall 3 3 3   Language- name 2 objects 2 2 2   Language- repeat 1 1 1   Language- follow 3 step command 3 3 3   Language- read & follow direction 1 1 1   Write a sentence 1 1 1   Copy design 1 1 1   Total score 29 29 30   animal naming 16 CRANIAL NERVES: CN II: Visual fields are full to confrontation. Pupils are round equal and briskly reactive to light. CN III, IV, VI: extraocular movement are normal. No ptosis. CN V: Facial sensation is intact to pinprick in all 3 divisions bilaterally. Corneal responses are intact.  CN VII: Face is symmetric with normal eye closure and smile. CN VIII: Hearing is normal to rubbing fingers CN IX, X: Palate  elevates symmetrically. Phonation is normal. CN XI: Head turning and shoulder shrug are intact CN XII: Tongue is midline with normal movements and no atrophy.  MOTOR: There is no pronator drift of out-stretched arms. Muscle bulk and tone are normal. Muscle strength is normal.  REFLEXES: Reflexes are 2+ and symmetric at the biceps, triceps, knees, and ankles. Plantar responses are flexor.  SENSORY: Intact to light touch, pinprick, position sense, and vibration sense are intact in fingers and toes.  COORDINATION: Rapid alternating movements and fine finger movements are intact. There is no dysmetria on finger-to-nose and heel-knee-shin.    GAIT/STANCE: Posture is normal. Gait is steady with normal steps, base, arm swing, and turning. Heel and toe walking are normal. Tandem gait is normal.  Romberg is absent.   DIAGNOSTIC DATA (LABS, IMAGING, TESTING) - I reviewed patient records, labs, notes, testing and imaging myself where available.   ASSESSMENT AND PLAN  BEYONKA PITNEY is a 71 y.o. 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 Ph.D.  [facility] [address] [place], [place] [address] Ph: [phone] Fax: [phone]  CC: Leighton Ruff, MD

## 2017-03-29 NOTE — Patient Instructions (Addendum)
Melatonin 3mg  every night    [facility]  [phone] Alzheimer's Disease and memory disorders links

## 2017-08-16 ENCOUNTER — Encounter (HOSPITAL_COMMUNITY): Payer: Self-pay | Admitting: Emergency Medicine

## 2017-08-16 ENCOUNTER — Emergency Department (HOSPITAL_COMMUNITY)
Admission: EM | Admit: 2017-08-16 | Discharge: 2017-08-16 | Disposition: A | Discharge status: Home / Self Care | Payer: Medicare Other | Attending: Emergency Medicine | Admitting: Emergency Medicine

## 2017-08-16 ENCOUNTER — Emergency Department (HOSPITAL_COMMUNITY): Payer: Medicare Other

## 2017-08-16 DIAGNOSIS — Z87891 Personal history of nicotine dependence: Secondary | ICD-10-CM | POA: Diagnosis not present

## 2017-08-16 DIAGNOSIS — R079 Chest pain, unspecified: Secondary | ICD-10-CM | POA: Diagnosis present

## 2017-08-16 DIAGNOSIS — Z91040 Latex allergy status: Secondary | ICD-10-CM | POA: Diagnosis not present

## 2017-08-16 DIAGNOSIS — Z885 Allergy status to narcotic agent status: Secondary | ICD-10-CM | POA: Insufficient documentation

## 2017-08-16 DIAGNOSIS — R0600 Dyspnea, unspecified: Secondary | ICD-10-CM | POA: Diagnosis not present

## 2017-08-16 LAB — CBC WITH DIFFERENTIAL/PLATELET
BASOS PCT: 0 %
Basophils Absolute: 0 10*3/uL (ref 0.0–0.1)
EOS ABS: 0.2 10*3/uL (ref 0.0–0.7)
Eosinophils Relative: 4 %
HEMATOCRIT: 42.7 % (ref 36.0–46.0)
Hemoglobin: 13.9 g/dL (ref 12.0–15.0)
Lymphocytes Relative: 32 %
Lymphs Abs: 2.1 10*3/uL (ref 0.7–4.0)
MCH: 29.6 pg (ref 26.0–34.0)
MCHC: 32.6 g/dL (ref 30.0–36.0)
MCV: 90.9 fL (ref 78.0–100.0)
MONO ABS: 0.6 10*3/uL (ref 0.1–1.0)
MONOS PCT: 9 %
NEUTROS ABS: 3.5 10*3/uL (ref 1.7–7.7)
Neutrophils Relative %: 55 %
Platelets: 190 10*3/uL (ref 150–400)
RBC: 4.7 MIL/uL (ref 3.87–5.11)
RDW: 12.7 % (ref 11.5–15.5)
WBC: 6.4 10*3/uL (ref 4.0–10.5)

## 2017-08-16 LAB — I-STAT CHEM 8, ED
BUN: 21 mg/dL — ABNORMAL HIGH (ref 6–20)
Calcium, Ion: 1.23 mmol/L (ref 1.15–1.40)
Chloride: 105 mmol/L (ref 101–111)
Creatinine, Ser: 0.7 mg/dL (ref 0.44–1.00)
Glucose, Bld: 95 mg/dL (ref 65–99)
HEMATOCRIT: 41 % (ref 36.0–46.0)
HEMOGLOBIN: 13.9 g/dL (ref 12.0–15.0)
Potassium: 3.6 mmol/L (ref 3.5–5.1)
Sodium: 145 mmol/L (ref 135–145)
TCO2: 28 mmol/L (ref 22–32)

## 2017-08-16 LAB — I-STAT TROPONIN, ED: Troponin i, poc: 0 ng/mL (ref 0.00–0.08)

## 2017-08-16 NOTE — ED Provider Notes (Signed)
[facility] DEPT Provider Note   CSN: [id number] Arrival date & time: 08/16/17  1651     History   Chief Complaint Chief Complaint  Patient presents with  . Chest Pain  . Anxiety    HPI Amanda Contreras is a 71 y.o. female.  She presents for evaluation of a vague sensation of "anxiousness," which she feels in her left upper chest region.  She has noticed this sensation several times in the last couple of days.  She also has a separate symptom, described as shortness of breath when walking up hills, with my dog.  This is been present intermittently over the last several days as well.  She typically walks her dog about 3 times a day.  Today she went to see her PCP for evaluation of these symptoms, was evaluated there and sent here for additional evaluation.  Her physician was concerned about the chest pain and shortness of breath, and an abnormal EKG.  The EKG tracing is supplied, and there appears to be a Q-wave in V2 with biphasic T-wave.  Her doctor compared this with a prior tracing from 09/01/2015, and noted that the V2 finding, was new.  The patient notes that she has cognitive impairment, and that she has trouble with her memory, and was prescribed Namzaric, but has not started taking it yet.  There are no other known modifying factors.  HPI  Past Medical History:  Diagnosis Date  . Arthritis   . Genital prolapse   . Heart murmur   . Hypercholesteremia     Patient Active Problem List   Diagnosis Date Noted  . Cognitive impairment 12/27/2015  . Cystocele 10/19/2015    Past Surgical History:  Procedure Laterality Date  . ANTERIOR AND POSTERIOR REPAIR N/A 10/19/2015   Procedure: ANTERIOR (CYSTOCELE) ;  Surgeon: Bjorn Loser, MD;  Location: [facility];  Service: Urology;  Laterality: N/A;  . CYSTOSCOPY N/A 10/19/2015   Procedure: CYSTOSCOPY;  Surgeon: Bjorn Loser, MD;  Location: [facility];  Service: Urology;  Laterality: N/A;  . DILATATION & CURETTAGE/HYSTEROSCOPY WITH  MYOSURE N/A 09/02/2015   Procedure: DILATATION & CURETTAGE/HYSTEROSCOPY WITH MYOSURE;  Surgeon: Servando Salina, MD;  Location: [facility];  Service: Gynecology;  Laterality: N/A;  . PUBOVAGINAL SLING N/A 10/19/2015   Procedure: Inetta Fermo  ;  Surgeon: Bjorn Loser, MD;  Location: [facility];  Service: Urology;  Laterality: N/A;  . SALPINGOOPHORECTOMY Bilateral 10/19/2015   Procedure: BILATERAL SALPINGO OOPHORECTOMY;  Surgeon: Servando Salina, MD;  Location: [facility];  Service: Gynecology;  Laterality: Bilateral;  . Uterine/Bladder Prolapse Surgery     Scheduled 10/19/15  . VAGINAL HYSTERECTOMY N/A 10/19/2015   Procedure: HYSTERECTOMY VAGINAL;  Surgeon: Servando Salina, MD;  Location: [facility];  Service: Gynecology;  Laterality: N/A;    OB History    No data available       Home Medications    Prior to Admission medications   Medication Sig Start Date End Date Taking? Authorizing Provider  Calcium-Phosphorus-Vitamin D (CITRACAL +D3 PO) Take 2 tablets by mouth 2 (two) times daily.   Yes [provider]  memantine (NAMENDA) 10 MG tablet TAKE 1 TABLET (10 MG TOTAL) BY MOUTH 2 (TWO) TIMES DAILY. 02/26/17  Yes Marcial Pacas, MD  Multiple Vitamins-Minerals (PRESERVISION AREDS 2 PO) Take 1 tablet by mouth 2 (two) times daily.    Yes [provider]  donepezil (ARICEPT) 10 MG tablet Take 1 tablet (10 mg total) by mouth at bedtime. Patient not taking: Reported on 08/16/2017  03/29/17   Marcial Pacas, MD  Memantine HCl-Donepezil HCl [facility]) 28-10 MG CP24 Take 1 tablet by mouth daily. Patient not taking: Reported on 08/16/2017 03/29/17   Marcial Pacas, MD    Family History Family History  Problem Relation Age of Onset  . Parkinson's disease Father   . Pancreatitis Maternal Grandmother   . Diabetes Maternal Grandfather   . Heart attack Maternal Grandfather   . Stomach cancer Paternal Grandfather   . Dementia Paternal Grandmother     Social History Social History  Substance Use  Topics  . Smoking status: Former Smoker    Types: Cigarettes    Quit date: 10/11/1988  . Smokeless tobacco: Never Used     Comment: Quit 1988  . Alcohol use 0.0 oz/week     Comment: Social     Allergies   Hydrocodone; Latex; and Sulfa antibiotics   Review of Systems Review of Systems  All other systems reviewed and are negative.    Physical Exam Updated Vital Signs BP (!) 132/59   Pulse (!) 56   Resp 13   Wt 60.8 kg (134 lb)   SpO2 99%   BMI 24.51 kg/m   Physical Exam  Constitutional: She is oriented to person, place, and time. She appears well-developed and well-nourished. No distress.  HENT:  Head: Normocephalic and atraumatic.  Eyes: Pupils are equal, round, and reactive to light. Conjunctivae and EOM are normal.  Neck: Normal range of motion and phonation normal. Neck supple.  Cardiovascular: Normal rate and regular rhythm.   Pulmonary/Chest: Effort normal and breath sounds normal. She exhibits no tenderness.  Abdominal: Soft. She exhibits no distension. There is no tenderness. There is no guarding.  Musculoskeletal: Normal range of motion. She exhibits no edema, tenderness or deformity.  Neurological: She is alert and oriented to person, place, and time. She exhibits normal muscle tone.  Skin: Skin is warm and dry.  Psychiatric: She has a normal mood and affect. Her behavior is normal.  Nursing note and vitals reviewed.    ED Treatments / Results  Labs (all labs ordered are listed, but only abnormal results are displayed) Labs Reviewed  I-STAT CHEM 8, ED - Abnormal; Notable for the following:       Result Value   BUN 21 (*)    All other components within normal limits  CBC WITH DIFFERENTIAL/PLATELET  I-STAT TROPONIN, ED    EKG  EKG Interpretation  Date/Time:  Thursday August 16 2017 17:09:44 EDT Ventricular Rate:  53 PR Interval:    QRS Duration: 89 QT Interval:  427 QTC Calculation: 401 R Axis:   43 Text Interpretation:  Sinus rhythm  Atrial premature complex since last tracing no significant change Confirmed by Daleen Bo [phone]) on 08/16/2017 5:25:52 PM       Radiology Dg Chest 2 View  Result Date: 08/16/2017 CLINICAL DATA:  SOB yesterday, abnormal EKG at doctors office today. Pt denies chest pain.Ex-smoker, quit x30 years ago. EXAM: CHEST  2 VIEW COMPARISON:  None. FINDINGS: Cardiac silhouette is borderline enlarged. No mediastinal or hilar masses. No evidence of adenopathy Clear lungs.  No pleural effusion or pneumothorax. Skeletal structures are demineralized but intact. IMPRESSION: No acute cardiopulmonary disease. Electronically Signed   By: Lajean Manes M.D.   On: 08/16/2017 18:38    Procedures Procedures (including critical care time)  Medications Ordered in ED Medications - No data to display   Initial Impression / Assessment and Plan / ED Course  I have reviewed the triage  vital signs and the nursing notes.  Pertinent labs & imaging results that were available during my care of the patient were reviewed by me and considered in my medical decision making (see chart for details).      No data found.   At discharge reevaluation with update and discussion. After initial assessment and treatment, an updated evaluation reveals she remained comfortable had no further complaints.Daleen Bo L      Final Clinical Impressions(s) / ED Diagnoses   Final diagnoses:  Chest pain, unspecified type  Dyspnea, unspecified type    Nonspecific chest pain or shortness of breath.  Reassuring evaluation.  Doubt ACS, PE or pneumonia.  Nursing Notes Reviewed/ Care Coordinated Applicable Imaging Reviewed Interpretation of Laboratory Data incorporated into ED treatment  The patient appears reasonably screened and/or stabilized for discharge and I doubt any other medical condition or other [facility] requiring further screening, evaluation, or treatment in the ED at this time prior to discharge.  Plan: Home  Medications-continue current medications; Home Treatments-rest; return here if the recommended treatment, does not improve the symptoms; Recommended follow up-PCP checkup 1 week.   New Prescriptions Discharge Medication List as of 08/16/2017  9:01 PM       Daleen Bo, MD 08/17/17 [phone]

## 2017-08-16 NOTE — ED Triage Notes (Addendum)
Pt in from home via [facility] with c/o intermittent cp and sob x 2 days. Pt states she has been under a lot of stress x few days. Per EMS, pt is very active but has had increased sob after exercise x 2 days. Denies cardiac hx, has hx of dementia. Alert, VSS, 97% on RA, EKG for EMS was NSR 69.

## 2017-08-16 NOTE — Discharge Instructions (Signed)
The testing which was done today to evaluate your chest pain and trouble breathing, was normal.  When we repeated her EKG, it appeared similar to your older tracings, before today.  There is no sign for heart attack, lung problems, or problems with her blood pressure.  It is important to follow-up with your primary care doctor for checkup next week, at which time she can consider further evaluation, or referral, as needed.  Return here, if needed, for problems.

## 2017-10-01 ENCOUNTER — Encounter: Payer: Self-pay | Admitting: Neurology

## 2017-10-01 ENCOUNTER — Ambulatory Visit (INDEPENDENT_AMBULATORY_CARE_PROVIDER_SITE_OTHER): Payer: Medicare Other | Admitting: Neurology

## 2017-10-01 VITALS — BP 138/71 | HR 64 | Ht 62.0 in | Wt 135.5 lb

## 2017-10-01 DIAGNOSIS — R4189 Other symptoms and signs involving cognitive functions and awareness: Secondary | ICD-10-CM | POA: Diagnosis not present

## 2017-10-01 NOTE — Progress Notes (Signed)
PATIENT: Amanda Contreras DOB: [birth year]  Chief Complaint  Patient presents with  . Cognitive Impairment    MMSE 29/30 -  11 animals.  She is here with her cousin - [name] and legal guardian, [name]. She was seen at [facility] for a second opinion in 07/2017.  She is only taking Namenda 10mg , BID.  She does not wish to take Aricept or transition to Namzaric.  She is also taking Remeron 7.5mg , one tablet daily.  Feels she is doing fine on her current medication regimen.     HISTORICAL  Amanda Contreras is a 71 years old right-handed female, seen in refer by  primary care physician [name], MD in September 23 2015 for evaluation of memory loss, she is accompanied by her cousin [name] at today's clinical visit.Initial evaluation was in October 2016.  She has past medical history of sensory neuronopathy hearing loss since 2012, hyperlipidemia, previous smoker, quit in 1989,  She had master degree, majored in [place] education, was a retired Pharmacist, hospital at age 32, she was widowed since 2012, also took care of her mother afterwards, who passed away in Spring of 2015.  I saw her previously for double vision in 2013, which has resolved, I personally reviewed MRI of the brain January 2013, mild small vessel disease, generalized atrophy.  Since spring of 2015, she was noted to have word finding difficulties, sometimes driving familiar route, she has to think hard to get to the destination, once she was lost in her urologist office. Still managing her own bill, lives with her dog at her household, forgetting appointments sometimes, she also complains of difficulty falling to sleep, not eating regularly, she is also on diet, lost over 30 pounds since 2015.  Her paternal grandmother suffered dementia.  Update January 71 2017: She was started on Namenda 10 mg twice a day since October 2016, She tolerates it well. She still has mild memory loss, she lives by her self, she still drives, " have to  think where I am going",  She has GPS, she has trouble keep things organized,  She eats well, mild insomnia, tends to sleep late.    She is seeing an elder care attorney, financial consultation, which has added on pressure to her.  UPDATE Sept 71th 2017: She is accompanied by her daughter and 2 cousins at today's clinical visit  we have evaluate her neuropsychological evaluation by Dr. [name]," profile considered together with the description of her everyday functioning would be consistent with Mild Cognitive Impairment, primarily amnestic type. Her relatively intact attentional and executive functioning coupled with her insight into her memory difficulties would bode well for her potential to maintain independent living"  She continue complains of progressive worsening memory loss, her daughter has made some arrangements to clean out her house, potential transition into  friend's home assistant living in 4 years,   Her father suffered Parkinson's disease, memory loss, paternal grandmother has memory loss.  UPDATE April 71 2018: She is with her daughter, and 2 cousins [name] and [name] at today's clinical visit, she is noted to be more anxious, increased difficulty on daily activity, has to be reminded about taking her medications, also has difficulty staying alone at evening time, make phone calls to her cousins in the middle of the night  She is taking Namenda 10 mg twice a day, did not try Aricept worry about the side effects, [facility]  UPDATE Oct 71 2018: She came in with her legal guardian Mr. [name], her cousin [name], she was seen by [name], August 03 2017, confirm her diagnosis of mild cognitive impairment, was also given the prescription of Remeron 7.5 mg every night for anxiety  She lives in her house since 2012, with her dog, there was a prolonged discussion of potential move to assisted living at friend's home, but  patient seems to be very resistant to the idea, she still driving short distance, but has very irregular eating habit, have friends and relatives check on her regularly.  REVIEW OF SYSTEMS: Full 14 system review of systems performed and notable only for double vision, loss of vision, insomnia, environmental allergy, dizziness, decreased concentration, depression, anxiety  ALLERGIES: Allergies  Allergen Reactions  . Hydrocodone Other (See Comments)    dizziness  . Latex Rash  . Sulfa Antibiotics Rash    HOME MEDICATIONS: Current Outpatient Prescriptions  Medication Sig Dispense Refill  . Calcium-Phosphorus-Vitamin D (CITRACAL +D3 PO) Take 2 tablets by mouth 2 (two) times daily.    . memantine (NAMENDA) 10 MG tablet TAKE 1 TABLET (10 MG TOTAL) BY MOUTH 2 (TWO) TIMES DAILY. 180 tablet 3  . mirtazapine (REMERON) 7.5 MG tablet START WITH 1/2 TABLET BY MOUTH AT NIGHT AND SLOWLY ADVANCE TO 2 TABLETS AT NIGHT  11  . Multiple Vitamins-Minerals (PRESERVISION AREDS 2 PO) Take 1 tablet by mouth 2 (two) times daily.      No current facility-administered medications for this visit.     PAST MEDICAL HISTORY: Past Medical History:  Diagnosis Date  . Arthritis   . Genital prolapse   . Heart murmur   . Hypercholesteremia     PAST SURGICAL HISTORY: Past Surgical History:  Procedure Laterality Date  . ANTERIOR AND POSTERIOR REPAIR N/A 10/19/2015   Procedure: ANTERIOR (CYSTOCELE) ;  Surgeon: Bjorn Loser, MD;  Location: [facility];  Service: Urology;  Laterality: N/A;  . CYSTOSCOPY N/A 10/19/2015   Procedure: CYSTOSCOPY;  Surgeon: Bjorn Loser, MD;  Location: [facility];  Service: Urology;  Laterality: N/A;  . DILATATION & CURETTAGE/HYSTEROSCOPY WITH MYOSURE N/A 09/02/2015   Procedure: DILATATION & CURETTAGE/HYSTEROSCOPY WITH MYOSURE;  Surgeon: Servando Salina, MD;  Location: [facility];  Service: Gynecology;  Laterality: N/A;  . PUBOVAGINAL SLING N/A 10/19/2015   Procedure: Inetta Fermo  ;   Surgeon: Bjorn Loser, MD;  Location: [facility];  Service: Urology;  Laterality: N/A;  . SALPINGOOPHORECTOMY Bilateral 10/19/2015   Procedure: BILATERAL SALPINGO OOPHORECTOMY;  Surgeon: Servando Salina, MD;  Location: [facility];  Service: Gynecology;  Laterality: Bilateral;  . Uterine/Bladder Prolapse Surgery     Scheduled 10/19/15  . VAGINAL HYSTERECTOMY N/A 10/19/2015   Procedure: HYSTERECTOMY VAGINAL;  Surgeon: Servando Salina, MD;  Location: [facility];  Service: Gynecology;  Laterality: N/A;    FAMILY HISTORY: Family History  Problem Relation Age of Onset  . Parkinson's disease Father   . Pancreatitis Maternal Grandmother   . Diabetes Maternal Grandfather   . Heart attack Maternal Grandfather   . Stomach cancer Paternal Grandfather   . Dementia Paternal Grandmother     SOCIAL HISTORY:  Social History   Social History  . Marital status: Widowed    Spouse name: N/A  . Number of children: 1  . Years of education: Masters   Occupational History  . Retired    Social History Main Topics  . Smoking status: Former Smoker    Types: Cigarettes    Quit date: 10/11/1988  .  Smokeless tobacco: Never Used     Comment: Quit 1988  . Alcohol use 0.0 oz/week     Comment: Social  . Drug use: No  . Sexual activity: Not on file   Other Topics Concern  . Not on file   Social History Narrative   Lives at home alone.   Right-handed.   2-4 cups per week.     PHYSICAL EXAM   Vitals:   10/01/17 1151  BP: 138/71  Pulse: 64  Weight: 135 lb 8 oz (61.5 kg)  Height: 5\' 2"  (1.575 m)    Not recorded      Body mass index is 24.78 kg/m.  PHYSICAL EXAMNIATION:  Gen: NAD, conversant, well nourised, obese, well groomed                     Cardiovascular: Regular rate rhythm, no peripheral edema, warm, nontender. Eyes: Conjunctivae clear without exudates or hemorrhage Neck: Supple, no carotid bruise. Pulmonary: Clear to auscultation bilaterally   NEUROLOGICAL EXAM:  MENTAL  STATUS: MMSE - Mini Mental State Exam 10/01/2017 03/29/2017 08/31/2016  Orientation to time 4 4 4   Orientation to Place 5 5 5   Registration 3 3 3   Attention/ Calculation 5 5 5   Recall 3 3 3   Language- name 2 objects 2 2 2   Language- repeat 1 1 1   Language- follow 3 step command 3 3 3   Language- read & follow direction 1 1 1   Write a sentence 1 1 1   Copy design 1 1 1   Total score 29 29 29   animal naming 11  CRANIAL NERVES: CN II: Visual fields are full to confrontation. Pupils are round equal and briskly reactive to light. CN III, IV, VI: extraocular movement are normal. No ptosis. CN V: Facial sensation is intact to pinprick in all 3 divisions bilaterally. Corneal responses are intact.  CN VII: Face is symmetric with normal eye closure and smile. CN VIII: Hearing is normal to rubbing fingers CN IX, X: Palate elevates symmetrically. Phonation is normal. CN XI: Head turning and shoulder shrug are intact CN XII: Tongue is midline with normal movements and no atrophy.  MOTOR: There is no pronator drift of out-stretched arms. Muscle bulk and tone are normal. Muscle strength is normal.  REFLEXES: Reflexes are 2+ and symmetric at the biceps, triceps, knees, and ankles. Plantar responses are flexor.  SENSORY: Intact to light touch, pinprick, position sense, and vibration sense are intact in fingers and toes.  COORDINATION: Rapid alternating movements and fine finger movements are intact. There is no dysmetria on finger-to-nose and heel-knee-shin.    GAIT/STANCE: Posture is normal. Gait is steady with normal steps, base, arm swing, and turning. Heel and toe walking are normal. Tandem gait is normal.  Romberg is absent.   DIAGNOSTIC DATA (LABS, IMAGING, TESTING) - I reviewed patient records, labs, notes, testing and imaging myself where available.   ASSESSMENT AND PLAN  Amanda Contreras is a 71 y.o. 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 Ph.D.  [facility] [address],  [place], [place] [address] Ph: [phone] Fax: [phone]  CC: Leighton Ruff, MD

## 2017-10-04 ENCOUNTER — Telehealth: Payer: Self-pay | Admitting: Neurology

## 2017-10-04 NOTE — Telephone Encounter (Signed)
Amanda Contreras (guardian) [phone] called said [facility] (p) [phone] needs the neurocognitive test that was done so they can determine what level she comes into the facility. Please fax to attn: Viviana Simpler (f) [phone]

## 2017-10-04 NOTE — Telephone Encounter (Signed)
Neuropsychiatic testing completed by Dr. Valentina Shaggy.

## 2017-10-04 NOTE — Telephone Encounter (Signed)
I called, Mr Amanda Contreras to let him know that, he would have to call the [facility] to request pt neuropsy testing @ [phone].

## 2017-11-14 NOTE — Telephone Encounter (Signed)
Reviewed her office visits with her and she is unclear why she was not approved for independent living. She has a copy of her neuropsychiatric testing and she is coming to sign a medical release form to pick up her records from [place].  She plans to contact [facility] to further discuss independent living placement.

## 2017-11-14 NOTE — Telephone Encounter (Signed)
Pt called said she was hoping to go into independent living but she has been approved for assisted living. Pt is concerned and does not understand what was sent to be told she has to go into assisted living. Please call to advise at [phone]

## 2018-03-06 ENCOUNTER — Other Ambulatory Visit: Payer: Self-pay | Admitting: Neurology

## 2018-04-01 ENCOUNTER — Ambulatory Visit: Payer: Medicare Other | Admitting: Neurology

## 2018-04-01 ENCOUNTER — Encounter: Payer: Self-pay | Admitting: Neurology

## 2018-04-01 VITALS — BP 129/64 | HR 62

## 2018-04-01 DIAGNOSIS — R4189 Other symptoms and signs involving cognitive functions and awareness: Secondary | ICD-10-CM | POA: Diagnosis not present

## 2018-04-01 NOTE — Progress Notes (Signed)
PATIENT: Amanda Contreras DOB: Oct 12, 1946  Chief Complaint  Patient presents with  . Memory Loss    MMSE 30/30 - 14 animals.  She is here with her former husband - now friend, Amanda Contreras.  Feels her memory has been stable.     HISTORICAL  Amanda Contreras is a 72 years old right-handed female, seen in refer by  primary care physician [name], MD in September 23 2015 for evaluation of memory loss, she is accompanied by her cousin Amanda Contreras at today's clinical visit.Initial evaluation was in October 2016.  She has past medical history of sensory neuronopathy hearing loss since 2012, hyperlipidemia, previous smoker, quit in 1989,  She had master degree, majored in [place] education, was a retired Pharmacist, hospital at age 80, she was widowed since 2012, also took care of her mother afterwards, who passed away in Spring of 2015.  I saw her previously for double vision in 2013, which has resolved, I personally reviewed MRI of the brain January 2013, mild small vessel disease, generalized atrophy.  Since spring of 2015, she was noted to have word finding difficulties, sometimes driving familiar route, she has to think hard to get to the destination, once she was lost in her urologist office. Still managing her own bill, lives with her dog at her household, forgetting appointments sometimes, she also complains of difficulty falling to sleep, not eating regularly, she is also on diet, lost over 30 pounds since 2015.  Her paternal grandmother suffered dementia.  Update December 27 2015: She was started on Namenda 10 mg twice a day since October 2016, She tolerates it well. She still has mild memory loss, she lives by her self, she still drives, " have to think where I am going",  She has GPS, she has trouble keep things organized,  She eats well, mild insomnia, tends to sleep late.    She is seeing an elder care attorney, financial consultation, which has added on pressure to her.  UPDATE Sept  28th 2017: She is accompanied by her daughter and 2 cousins at today's clinical visit  we have evaluate her neuropsychological evaluation by Dr. [name]," profile considered together with the description of her everyday functioning would be consistent with Mild Cognitive Impairment, primarily amnestic type. Her relatively intact attentional and executive functioning coupled with her insight into her memory difficulties would bode well for her potential to maintain independent living"  She continue complains of progressive worsening memory loss, her daughter has made some arrangements to clean out her house, potential transition into  friend's home assistant living in 4 years,   Her father suffered Parkinson's disease, memory loss, paternal grandmother has memory loss.  UPDATE March 29 2017: She is with her daughter, and 2 cousins Amanda Contreras and Amanda Contreras at today's clinical visit, she is noted to be more anxious, increased difficulty on daily activity, has to be reminded about taking her medications, also has difficulty staying alone at evening time, make phone calls to her cousins in the middle of the night  She is taking Namenda 10 mg twice a day, did not try Aricept worry about the side effects, [facility]  UPDATE Oct 01 2017: She came in with her legal guardian Mr. Amanda Contreras, her cousin Amanda Contreras, she was seen by [name], August 03 2017, confirm her diagnosis of mild cognitive impairment, was also given the prescription of Remeron 7.5 mg every night for anxiety  She lives in her house since 2012, with her dog, there was a prolonged discussion of potential move to assisted living at friend's home, but patient seems to be very resistant to the idea, she still driving short distance, but has very irregular eating habit, have friends and relatives check on her regularly.  UPDATE April 01 2018: She is here with her former husband, now friend and power  of attorney Mr. Amanda Contreras, she continues to have slow worsening memory loss, complains of naming difficulty, she lives independently, family struggled to persuade her to move to a supervised environment   REVIEW OF SYSTEMS: Full 14 system review of systems performed and notable only for as above. ALLERGIES: Allergies  Allergen Reactions  . Hydrocodone Other (See Comments)    dizziness  . Latex Rash  . Sulfa Antibiotics Rash    HOME MEDICATIONS: Current Outpatient Medications  Medication Sig Dispense Refill  . Calcium-Phosphorus-Vitamin D (CITRACAL +D3 PO) Take 2 tablets by mouth 2 (two) times daily.    . memantine (NAMENDA) 10 MG tablet TAKE 1 TABLET (10 MG TOTAL) BY MOUTH 2 (TWO) TIMES DAILY. 180 tablet 3  . mirtazapine (REMERON) 7.5 MG tablet START WITH 1/2 TABLET BY MOUTH AT NIGHT AND SLOWLY ADVANCE TO 2 TABLETS AT NIGHT  11  . Multiple Vitamins-Minerals (PRESERVISION AREDS 2 PO) Take 1 tablet by mouth 2 (two) times daily.      No current facility-administered medications for this visit.     PAST MEDICAL HISTORY: Past Medical History:  Diagnosis Date  . Arthritis   . Genital prolapse   . Heart murmur   . Hypercholesteremia     PAST SURGICAL HISTORY: Past Surgical History:  Procedure Laterality Date  . ANTERIOR AND POSTERIOR REPAIR N/A 10/19/2015   Procedure: ANTERIOR (CYSTOCELE) ;  Surgeon: Bjorn Loser, MD;  Location: [facility];  Service: Urology;  Laterality: N/A;  . CYSTOSCOPY N/A 10/19/2015   Procedure: CYSTOSCOPY;  Surgeon: Bjorn Loser, MD;  Location: [facility];  Service: Urology;  Laterality: N/A;  . DILATATION & CURETTAGE/HYSTEROSCOPY WITH MYOSURE N/A 09/02/2015   Procedure: DILATATION & CURETTAGE/HYSTEROSCOPY WITH MYOSURE;  Surgeon: Servando Salina, MD;  Location: [facility];  Service: Gynecology;  Laterality: N/A;  . PUBOVAGINAL SLING N/A 10/19/2015   Procedure: Inetta Fermo  ;  Surgeon: Bjorn Loser, MD;  Location: [facility];  Service: Urology;   Laterality: N/A;  . SALPINGOOPHORECTOMY Bilateral 10/19/2015   Procedure: BILATERAL SALPINGO OOPHORECTOMY;  Surgeon: Servando Salina, MD;  Location: [facility];  Service: Gynecology;  Laterality: Bilateral;  . Uterine/Bladder Prolapse Surgery     Scheduled 10/19/15  . VAGINAL HYSTERECTOMY N/A 10/19/2015   Procedure: HYSTERECTOMY VAGINAL;  Surgeon: Servando Salina, MD;  Location: [facility];  Service: Gynecology;  Laterality: N/A;    FAMILY HISTORY: Family History  Problem Relation Age of Onset  . Parkinson's disease Father   . Pancreatitis Maternal Grandmother   . Diabetes Maternal Grandfather   . Heart attack Maternal Grandfather   . Stomach cancer Paternal Grandfather   . Dementia Paternal Grandmother     SOCIAL HISTORY:  Social History   Socioeconomic History  . Marital status: Widowed    Spouse name: Not on file  . Number of children: 1  . Years of education: Masters  . Highest education level: Not on file  Occupational History  . Occupation: Retired  Scientific laboratory technician  . Financial resource strain: Not on file  . Food insecurity:    Worry: Not on file    Inability:  Not on file  . Transportation needs:    Medical: Not on file    Non-medical: Not on file  Tobacco Use  . Smoking status: Former Smoker    Types: Cigarettes    Last attempt to quit: 10/11/1988    Years since quitting: 29.4  . Smokeless tobacco: Never Used  . Tobacco comment: Quit 1988  Substance and Sexual Activity  . Alcohol use: Yes    Alcohol/week: 0.0 oz    Comment: Social  . Drug use: No  . Sexual activity: Not on file  Lifestyle  . Physical activity:    Days per week: Not on file    Minutes per session: Not on file  . Stress: Not on file  Relationships  . Social connections:    Talks on phone: Not on file    Gets together: Not on file    Attends religious service: Not on file    Active member of club or organization: Not on file    Attends meetings of clubs or organizations: Not on file     Relationship status: Not on file  . Intimate partner violence:    Fear of current or ex partner: Not on file    Emotionally abused: Not on file    Physically abused: Not on file    Forced sexual activity: Not on file  Other Topics Concern  . Not on file  Social History Narrative   Lives at home alone.   Right-handed.   2-4 cups per week.     PHYSICAL EXAM   Vitals:   04/01/18 1305  BP: 129/64  Pulse: 62    Not recorded      There is no height or weight on file to calculate BMI.  PHYSICAL EXAMNIATION:  Gen: NAD, conversant, well nourised, obese, well groomed                     Cardiovascular: Regular rate rhythm, no peripheral edema, warm, nontender. Eyes: Conjunctivae clear without exudates or hemorrhage Neck: Supple, no carotid bruise. Pulmonary: Clear to auscultation bilaterally   NEUROLOGICAL EXAM:  MENTAL STATUS: MMSE - Mini Mental State Exam 04/01/2018 10/01/2017 03/29/2017  Orientation to time 5 4 4   Orientation to Place 5 5 5   Registration 3 3 3   Attention/ Calculation 5 5 5   Recall 3 3 3   Language- name 2 objects 2 2 2   Language- repeat 1 1 1   Language- follow 3 step command 3 3 3   Language- read & follow direction 1 1 1   Write a sentence 1 1 1   Copy design 1 1 1   Total score 30 29 29   animal naming 14  CRANIAL NERVES: CN II: Visual fields are full to confrontation. Pupils are round equal and briskly reactive to light. CN III, IV, VI: extraocular movement are normal. No ptosis. CN V: Facial sensation is intact to pinprick in all 3 divisions bilaterally. Corneal responses are intact.  CN VII: Face is symmetric with normal eye closure and smile. CN VIII: Hearing is normal to rubbing fingers CN IX, X: Palate elevates symmetrically. Phonation is normal. CN XI: Head turning and shoulder shrug are intact CN XII: Tongue is midline with normal movements and no atrophy.  MOTOR: There is no pronator drift of out-stretched arms. Muscle bulk and tone are  normal. Muscle strength is normal.  REFLEXES: Reflexes are 2+ and symmetric at the biceps, triceps, knees, and ankles. Plantar responses are flexor.  SENSORY: Intact to light touch,  pinprick, position sense, and vibration sense are intact in fingers and toes.  COORDINATION: Rapid alternating movements and fine finger movements are intact. There is no dysmetria on finger-to-nose and heel-knee-shin.    GAIT/STANCE: Posture is normal. Gait is steady with normal steps, base, arm swing, and turning. Heel and toe walking are normal. Tandem gait is normal.  Romberg is absent.   DIAGNOSTIC DATA (LABS, IMAGING, TESTING) - I reviewed patient records, labs, notes, testing and imaging myself where available.   ASSESSMENT AND PLAN  Amanda Contreras is a 72 y.o. 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 Ph.D.  [facility] [address] [place], [place] [address] Ph: [phone] Fax: [phone]  CC: Leighton Ruff, MD

## 2018-09-03 ENCOUNTER — Telehealth: Payer: Self-pay | Admitting: *Deleted

## 2018-09-03 NOTE — Telephone Encounter (Signed)
LVM unable to reach pt. If pt call back she needs to sign release form.

## 2018-09-23 ENCOUNTER — Telehealth: Payer: Self-pay | Admitting: *Deleted

## 2018-09-23 NOTE — Telephone Encounter (Signed)
Received a call from [facility] with Dr. Talbert Forest' office (ophthalmologist).  States patient was being evaluated for cataracts but her exam showed concern for glaucoma.  However, her intraocular pressure and labs came back normal.  He would like to repeat MRI brain with attention to orbits.  He wanted to see if Dr. Krista Blue agreed with this choice of testing.  She felt if the patient exhibited signs justifying a repeat MRI, then she should move forward with the scan.  Her last MRI brain was in 2013.

## 2018-10-22 IMAGING — CR DG CHEST 2V
2 series · 2 of 2 positions shown · non-contrast
Comparison: None.

CLINICAL DATA: SOB yesterday, abnormal EKG at doctors office today.
Pt denies chest pain.Ex-smoker, quit x30 years ago.

EXAM:
CHEST  2 VIEW

[chest pa]
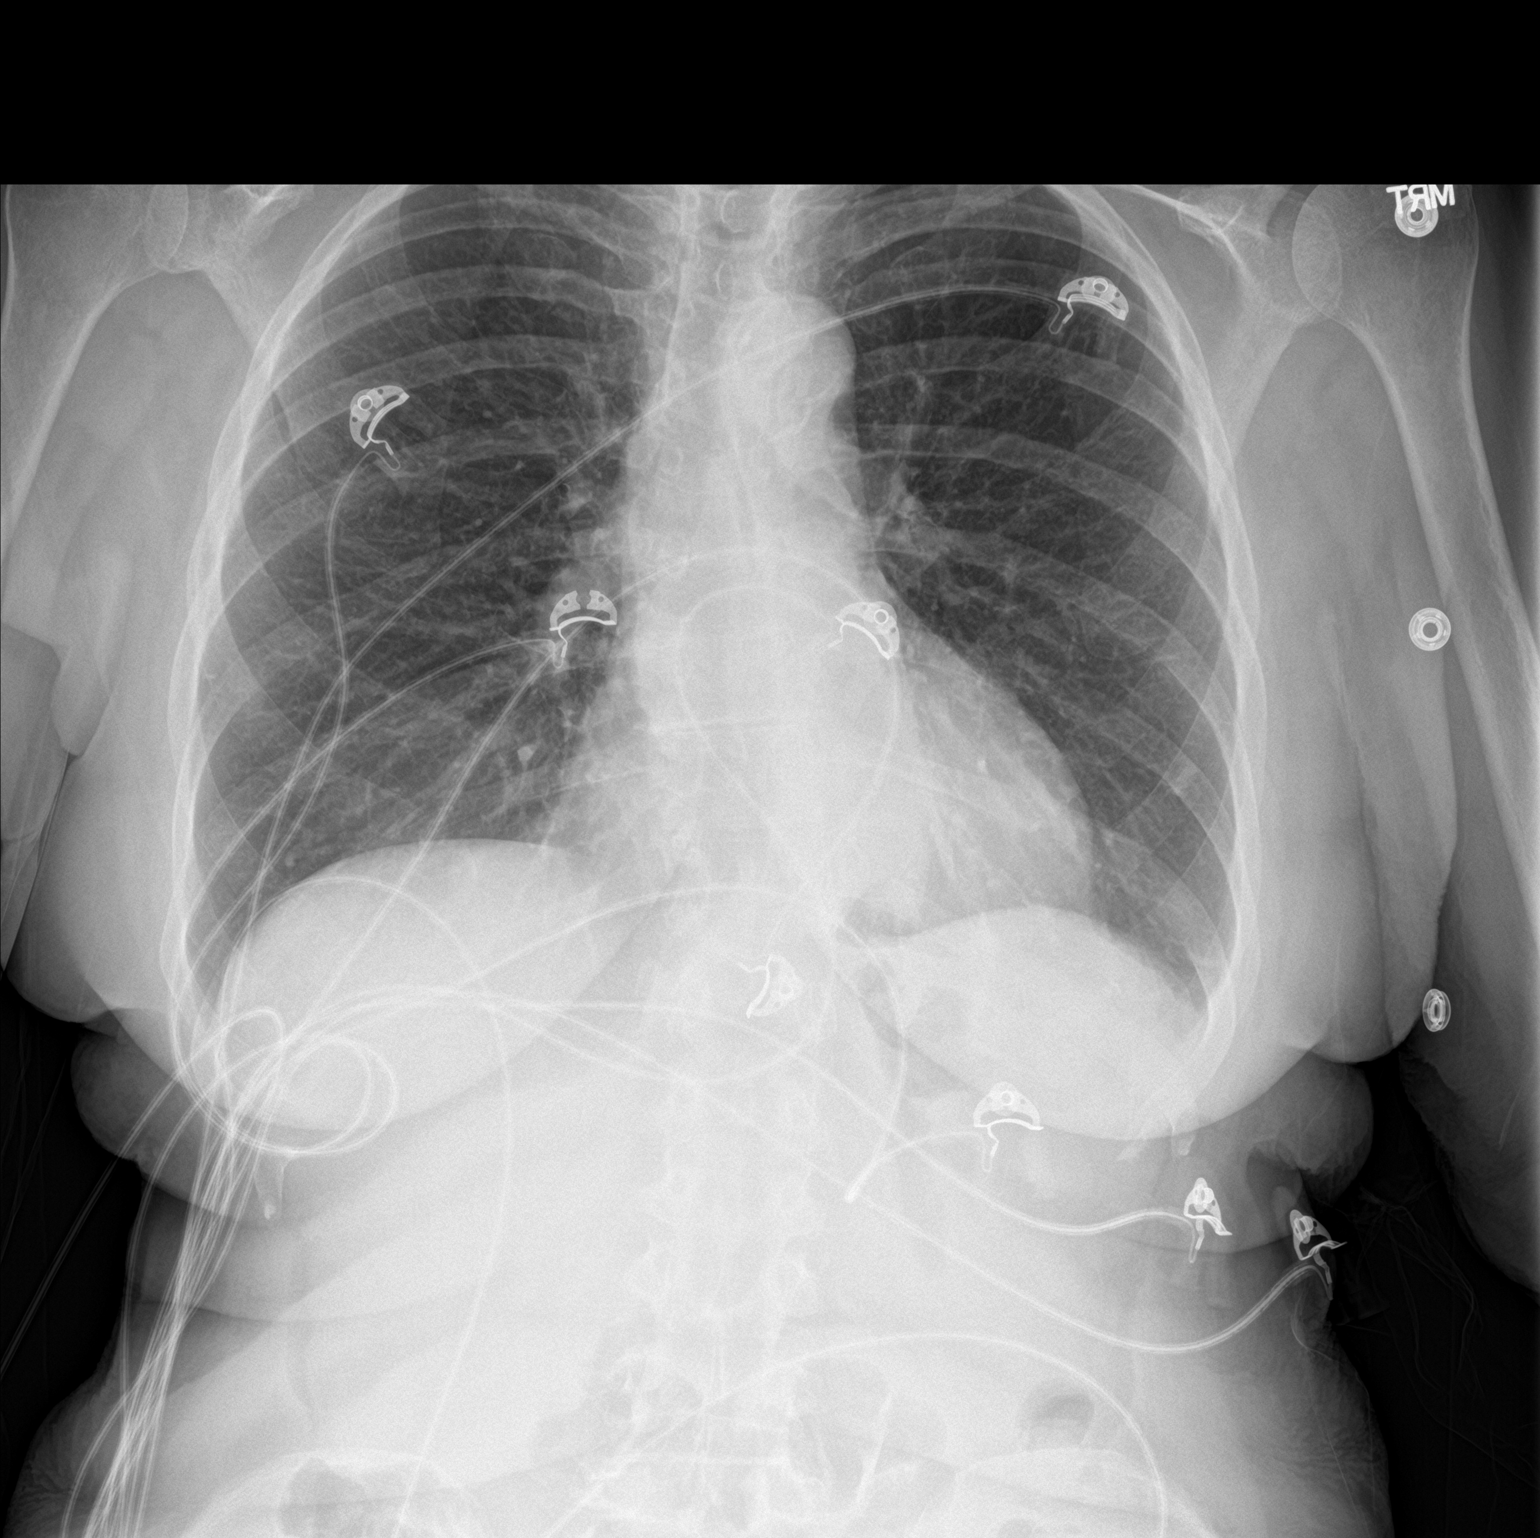

[chest lat]
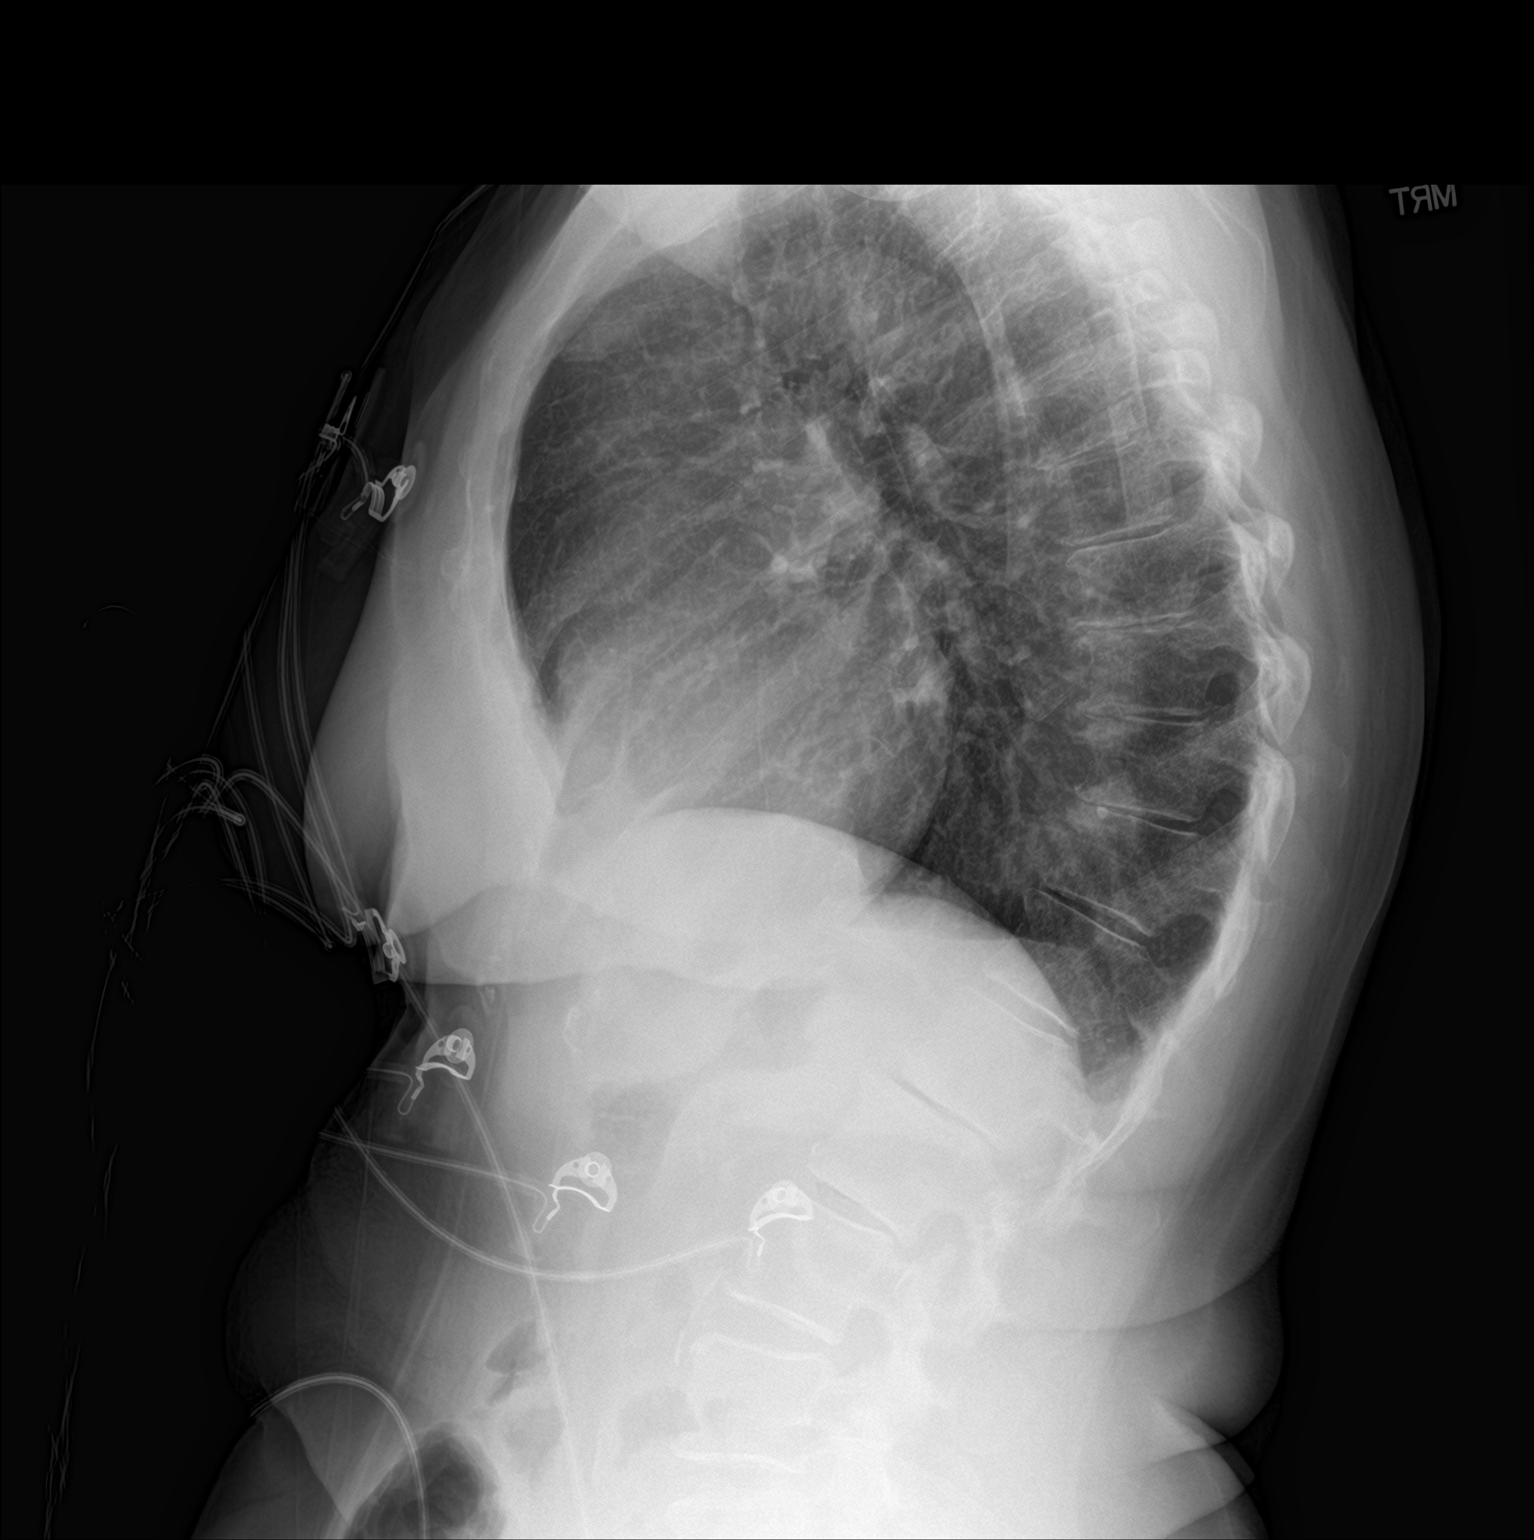

[2 of 2 positions shown; findings below may reference images not displayed]

FINDINGS: Cardiac silhouette is borderline enlarged. No mediastinal or hilar
masses. No evidence of adenopathy

Clear lungs.  No pleural effusion or pneumothorax.

Skeletal structures are demineralized but intact.
IMPRESSION: No acute cardiopulmonary disease.

## 2018-12-12 ENCOUNTER — Other Ambulatory Visit: Payer: Self-pay | Admitting: Family Medicine

## 2018-12-12 DIAGNOSIS — E041 Nontoxic single thyroid nodule: Secondary | ICD-10-CM

## 2018-12-24 ENCOUNTER — Ambulatory Visit
Admission: RE | Admit: 2018-12-24 | Discharge: 2018-12-24 | Disposition: A | Discharge status: Home / Self Care | Payer: Medicare Other | Source: Ambulatory Visit | Attending: Family Medicine | Admitting: Family Medicine

## 2018-12-24 DIAGNOSIS — E041 Nontoxic single thyroid nodule: Secondary | ICD-10-CM

## 2019-04-09 ENCOUNTER — Ambulatory Visit: Payer: Medicare Other | Admitting: Neurology

## 2019-04-15 ENCOUNTER — Other Ambulatory Visit: Payer: Self-pay | Admitting: Neurology

## 2019-06-18 ENCOUNTER — Other Ambulatory Visit: Payer: Self-pay | Admitting: Neurology

## 2019-06-23 ENCOUNTER — Telehealth: Payer: Self-pay | Admitting: Neurology

## 2019-06-23 NOTE — Telephone Encounter (Signed)
06-23-19 Mychart virtual visit 1 year f/u Pt gave verbal consent to file insurance for Virtual Visit

## 2019-06-23 NOTE — Telephone Encounter (Signed)
Patient requested virtual visit for follow up.  The phone room scheduled her for a mychart visit on 08/12/2019.

## 2019-07-03 ENCOUNTER — Telehealth: Payer: Self-pay | Admitting: Neurology

## 2019-07-03 MED ORDER — MEMANTINE HCL 10 MG PO TABS: 10.0000 mg | ORAL_TABLET | Freq: Two times a day (BID) | ORAL | 0 refills | Status: DC

## 2019-07-03 NOTE — Telephone Encounter (Signed)
The patient has scheduled a follow up and the refill has been sent to the pharmacy.

## 2019-07-03 NOTE — Telephone Encounter (Signed)
Pt is requesting a refill of Memantine HCl 10 MG   , to be sent to [id number] - [place] [place]  Pt has appt scheduled for 08/12/2019

## 2019-08-12 ENCOUNTER — Telehealth (INDEPENDENT_AMBULATORY_CARE_PROVIDER_SITE_OTHER): Payer: Medicare Other | Admitting: Neurology

## 2019-08-12 DIAGNOSIS — R4189 Other symptoms and signs involving cognitive functions and awareness: Secondary | ICD-10-CM | POA: Diagnosis not present

## 2019-08-12 MED ORDER — DONEPEZIL HCL 10 MG PO TABS: 10.0000 mg | ORAL_TABLET | Freq: Every day | ORAL | 11 refills | Status: DC

## 2019-08-12 MED ORDER — MEMANTINE HCL 10 MG PO TABS: 10.0000 mg | ORAL_TABLET | Freq: Two times a day (BID) | ORAL | 4 refills | Status: DC

## 2019-08-12 NOTE — Progress Notes (Signed)
PATIENT: Amanda Contreras DOB: [birth year]  No chief complaint on file.    HISTORICAL  Amanda Contreras is a 73 years old right-handed female, seen in refer by  primary care physician [name], MD in September 23 2015 for evaluation of memory loss, she is accompanied by her cousin [name] at today's clinical visit.Initial evaluation was in October 2016.  She has past medical history of sensory neuronopathy hearing loss since 2012, hyperlipidemia, previous smoker, quit in 1989,  She had master degree, majored in [place] education, was a retired Pharmacist, hospital at age 52, she was widowed since 2012, also took care of her mother afterwards, who passed away in Spring of 2015.  I saw her previously for double vision in 2013, which has resolved, I personally reviewed MRI of the brain January 2013, mild small vessel disease, generalized atrophy.  Since spring of 2015, she was noted to have word finding difficulties, sometimes driving familiar route, she has to think hard to get to the destination, once she was lost in her urologist office. Still managing her own bill, lives with her dog at her household, forgetting appointments sometimes, she also complains of difficulty falling to sleep, not eating regularly, she is also on diet, lost over 30 pounds since 2015.  Her paternal grandmother suffered dementia.  Update December 27 2015: She was started on Namenda 10 mg twice a day since October 2016, She tolerates it well. She still has mild memory loss, she lives by her self, she still drives, " have to think where I am going",  She has GPS, she has trouble keep things organized,  She eats well, mild insomnia, tends to sleep late.    She is seeing an elder care attorney, financial consultation, which has added on pressure to her.  UPDATE Sept 28th 2017: She is accompanied by her daughter and 2 cousins at today's clinical visit  we have evaluate her neuropsychological evaluation by Dr. [name],"  profile considered together with the description of her everyday functioning would be consistent with Mild Cognitive Impairment, primarily amnestic type. Her relatively intact attentional and executive functioning coupled with her insight into her memory difficulties would bode well for her potential to maintain independent living"  She continue complains of progressive worsening memory loss, her daughter has made some arrangements to clean out her house, potential transition into  friend's home assistant living in 4 years,   Her father suffered Parkinson's disease, memory loss, paternal grandmother has memory loss.  UPDATE March 29 2017: She is with her daughter, and 2 cousins [name] and [name] at today's clinical visit, she is noted to be more anxious, increased difficulty on daily activity, has to be reminded about taking her medications, also has difficulty staying alone at evening time, make phone calls to her cousins in the middle of the night  She is taking Namenda 10 mg twice a day, did not try Aricept worry about the side effects, [facility]  UPDATE Oct 01 2017: She came in with her legal guardian Mr. [name], her cousin [name], she was seen by [name], August 03 2017, confirm her diagnosis of mild , was also given the prescription of Remeron 7.5 mg every night for anxiety  She lives in her house since 2012, with her dog, there was a prolonged discussion of potential move to assisted living at friend's home, but patient seems to be very resistant to the idea,  she still driving short distance, but has very irregular eating habit, have friends and relatives check on her regularly.  UPDATE April 01 2018: She is here with her former husband, now friend and power of attorney Mr. [name], she continues to have slow worsening memory loss, complains of naming difficulty, she lives independently, family struggled to persuade her to  move to a supervised environment  Virtual Visit via Video   I connected with [name] on 08/13/19 at  by Video and verified that I am speaking with the correct person using two identifiers.   I discussed the limitations, risks, security and privacy concerns of performing an evaluation and management service by video and the availability of in person appointments. I also discussed with the patient that there may be a patient responsible charge related to this service. The patient expressed understanding and agreed to proceed.   History of Present Illness: She is overall stable, continue to be physically active, tolerating Namenda 10 mg twice a day, no longer taking Remeron she exercise regularly   Observations/Objective: I have reviewed problem lists, medications, allergies.  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 The patient was provided an opportunity to ask questions and all were answered. The patient agreed with the plan and demonstrated an understanding of the instructions.   The patient was advised to call back or seek an in-person evaluation if the symptoms worsen or if the condition fails to improve as anticipated.  I provided 15 minutes of non-face-to-face time during this encounter.   Marcial Pacas, MD

## 2019-08-13 ENCOUNTER — Encounter: Payer: Self-pay | Admitting: Neurology

## 2019-12-22 ENCOUNTER — Institutional Professional Consult (permissible substitution): Payer: Medicare Other | Admitting: Neurology

## 2019-12-27 ENCOUNTER — Ambulatory Visit: Payer: Self-pay | Attending: Internal Medicine

## 2019-12-27 DIAGNOSIS — Z23 Encounter for immunization: Secondary | ICD-10-CM | POA: Insufficient documentation

## 2019-12-27 NOTE — Progress Notes (Signed)
   Covid-19 Vaccination Clinic  Name:  Amanda Contreras    MRN: [id number] DOB: [birth year]  12/27/2019  [name] was observed post Covid-19 immunization for 15 minutes without incidence. She was provided with Vaccine Information Sheet and instruction to access the V-Safe system.   [name] was instructed to call 911 with any severe reactions post vaccine: Marland Kitchen Difficulty breathing  . Swelling of your face and throat  . A fast heartbeat  . A bad rash all over your body  . Dizziness and weakness    Immunizations Administered    Name Date Dose VIS Date Route   Pfizer COVID-19 Vaccine 12/27/2019  2:04 PM 0.3 mL 11/14/2019 Intramuscular   Manufacturer: [place]   Lot: [id number]   [place]: [id number]

## 2020-01-17 ENCOUNTER — Ambulatory Visit: Payer: Self-pay

## 2020-01-19 ENCOUNTER — Telehealth: Payer: Self-pay | Admitting: *Deleted

## 2020-01-19 ENCOUNTER — Ambulatory Visit: Payer: Self-pay

## 2020-01-19 NOTE — Telephone Encounter (Signed)
Attempted to contact pt to schedule 2nd [place]; let messages on cell and home phones with callback number [phone].

## 2020-01-24 ENCOUNTER — Ambulatory Visit: Payer: Medicare PPO | Attending: Internal Medicine

## 2020-01-24 DIAGNOSIS — Z23 Encounter for immunization: Secondary | ICD-10-CM | POA: Insufficient documentation

## 2020-01-24 NOTE — Progress Notes (Signed)
   Covid-19 Vaccination Clinic  Name:  Amanda Contreras    MRN: [id number] DOB: [birth year]  01/24/2020  [name] was observed post Covid-19 immunization for 15 minutes without incidence. She was provided with Vaccine Information Sheet and instruction to access the V-Safe system.   [name] was instructed to call 911 with any severe reactions post vaccine: Marland Kitchen Difficulty breathing  . Swelling of your face and throat  . A fast heartbeat  . A bad rash all over your body  . Dizziness and weakness    Immunizations Administered    Name Date Dose VIS Date Route   Pfizer COVID-19 Vaccine 01/24/2020 10:32 AM 0.3 mL 11/14/2019 Intramuscular   Manufacturer: [place]   Lot: [id number]   [place]: [id number]

## 2020-04-20 ENCOUNTER — Encounter: Payer: Self-pay | Admitting: Neurology

## 2020-04-20 ENCOUNTER — Ambulatory Visit: Payer: Medicare PPO | Admitting: Neurology

## 2020-04-20 ENCOUNTER — Other Ambulatory Visit: Payer: Self-pay

## 2020-04-20 VITALS — BP 128/71 | HR 64 | Ht 62.0 in | Wt 130.0 lb

## 2020-04-20 DIAGNOSIS — R4189 Other symptoms and signs involving cognitive functions and awareness: Secondary | ICD-10-CM | POA: Diagnosis not present

## 2020-04-20 MED ORDER — MEMANTINE HCL 10 MG PO TABS: 10.0000 mg | ORAL_TABLET | Freq: Two times a day (BID) | ORAL | 4 refills | Status: DC

## 2020-04-20 MED ORDER — DONEPEZIL HCL 10 MG PO TABS: 10.0000 mg | ORAL_TABLET | Freq: Every day | ORAL | 4 refills | Status: DC

## 2020-04-20 NOTE — Progress Notes (Signed)
PATIENT: Amanda Contreras DOB: 1946-04-07  Chief Complaint  Patient presents with  . Memory Loss    MMSE 28/30 - 11 animals. She is here with her friend, Lorna Few. Reports memory to be mildly worse. She is only taking memantine 10mg  BID. She ran out of donepezil and did not realize she had refills at the pharmacy. She does not remember when she stopped the medication.     HISTORICAL  Amanda Contreras is a 74 years old right-handed female, seen in refer by  primary care physician [name], MD in September 23 2015 for evaluation of memory loss, she is accompanied by her cousin [name] at today's clinical visit.Initial evaluation was in October 2016.  She has past medical history of sensory neuronopathy hearing loss since 2012, hyperlipidemia, previous smoker, quit in 1989,  She had master degree, majored in [place] education, was a retired Pharmacist, hospital at age 41, she was widowed since 2012, also took care of her mother afterwards, who passed away in Spring of 2015.  I saw her previously for double vision in 2013, which has resolved, I personally reviewed MRI of the brain January 2013, mild small vessel disease, generalized atrophy.  Since spring of 2015, she was noted to have word finding difficulties, sometimes driving familiar route, she has to think hard to get to the destination, once she was lost in her urologist office. Still managing her own bill, lives with her dog at her household, forgetting appointments sometimes, she also complains of difficulty falling to sleep, not eating regularly, she is also on diet, lost over 30 pounds since 2015.  Her paternal grandmother suffered dementia.  Update December 27 2015: She was started on Namenda 10 mg twice a day since October 2016, She tolerates it well. She still has mild memory loss, she lives by her self, she still drives, " have to think where I am going",  She has GPS, she has trouble keep things organized,  She eats well, mild  insomnia, tends to sleep late.    She is seeing an elder care attorney, financial consultation, which has added on pressure to her.  UPDATE Sept 28th 2017: She is accompanied by her daughter and 2 cousins at today's clinical visit  we have evaluate her neuropsychological evaluation by Dr. [name]," profile considered together with the description of her everyday functioning would be consistent with Mild Cognitive Impairment, primarily amnestic type. Her relatively intact attentional and executive functioning coupled with her insight into her memory difficulties would bode well for her potential to maintain independent living"  She continue complains of progressive worsening memory loss, her daughter has made some arrangements to clean out her house, potential transition into  friend's home assistant living in 4 years,   Her father suffered Parkinson's disease, memory loss, paternal grandmother has memory loss.  UPDATE March 29 2017: She is with her daughter, and 2 cousins [name] and [name] at today's clinical visit, she is noted to be more anxious, increased difficulty on daily activity, has to be reminded about taking her medications, also has difficulty staying alone at evening time, make phone calls to her cousins in the middle of the night  She is taking Namenda 10 mg twice a day, did not try Aricept worry about the side effects, [facility]  UPDATE Oct 01 2017: She came in with her legal guardian Mr. [name], her cousin [name], she was seen by [name],  August 03 2017, confirm her diagnosis of mild , was also given the prescription of Remeron 7.5 mg every night for anxiety  She lives in her house since 2012, with her dog, there was a prolonged discussion of potential move to assisted living at friend's home, but patient seems to be very resistant to the idea, she still driving short distance, but has very irregular eating  habit, have friends and relatives check on her regularly.  UPDATE April 01 2018: She is here with her former husband, now friend and power of attorney Mr. [name], she continues to have slow worsening memory loss, complains of naming difficulty, she lives independently, family struggled to persuade her to move to a supervised environment  Virtual Visit via Video  08/12/2019:  She is overall stable, continue to be physically active, tolerating Namenda 10 mg twice a day, no longer taking Remeron she exercise regularly  UPDATE Apr 20 2020: She is accompanied by her friend [name] at today's visit, she is overall doing well, missing her medication sometimes, she enjoys spending time with her dog,  She has more trouble finding direction, has difficulty using GPS, laboratory evaluation in October 2020, LDL 149, normal TSH 1.78, normal CMP, creatinine of 0.69, B12 of 480  I personally reviewed her last MRI of in 2013,  REVIEW OF SYSTEMS: Full 14 system review of systems performed and notable only for as above All other review of systems were negative.  ALLERGIES: Allergies  Allergen Reactions  . Hydrocodone Other (See Comments)    dizziness  . Latex Rash  . Sulfa Antibiotics Rash    HOME MEDICATIONS: Current Outpatient Medications  Medication Sig Dispense Refill  . donepezil (ARICEPT) 10 MG tablet Take 1 tablet (10 mg total) by mouth at bedtime. 30 tablet 11  . memantine (NAMENDA) 10 MG tablet Take 1 tablet (10 mg total) by mouth 2 (two) times daily. 180 tablet 4  . Multiple Vitamins-Minerals (PRESERVISION AREDS 2 PO) Take 1 tablet by mouth 2 (two) times daily.      No current facility-administered medications for this visit.    PAST MEDICAL HISTORY: Past Medical History:  Diagnosis Date  . Arthritis   . Genital prolapse   . Heart murmur   . Hypercholesteremia     PAST SURGICAL HISTORY: Past Surgical History:  Procedure Laterality Date  . ANTERIOR AND POSTERIOR  REPAIR N/A 10/19/2015   Procedure: ANTERIOR (CYSTOCELE) ;  Surgeon: Bjorn Loser, MD;  Location: [facility];  Service: Urology;  Laterality: N/A;  . CYSTOSCOPY N/A 10/19/2015   Procedure: CYSTOSCOPY;  Surgeon: Bjorn Loser, MD;  Location: [facility];  Service: Urology;  Laterality: N/A;  . DILATATION & CURETTAGE/HYSTEROSCOPY WITH MYOSURE N/A 09/02/2015   Procedure: DILATATION & CURETTAGE/HYSTEROSCOPY WITH MYOSURE;  Surgeon: Servando Salina, MD;  Location: [facility];  Service: Gynecology;  Laterality: N/A;  . PUBOVAGINAL SLING N/A 10/19/2015   Procedure: Inetta Fermo  ;  Surgeon: Bjorn Loser, MD;  Location: [facility];  Service: Urology;  Laterality: N/A;  . SALPINGOOPHORECTOMY Bilateral 10/19/2015   Procedure: BILATERAL SALPINGO OOPHORECTOMY;  Surgeon: Servando Salina, MD;  [facility]: [facility];  Service: Gynecology;  Laterality: Bilateral;  . Uterine/Bladder Prolapse Surgery     Scheduled 10/19/15  . VAGINAL HYSTERECTOMY N/A 10/19/2015   Procedure: HYSTERECTOMY VAGINAL;  Surgeon: Servando Salina, MD;  Location: [facility];  Service: Gynecology;  Laterality: N/A;    FAMILY HISTORY: Family History  Problem Relation Age of Onset  . Parkinson's disease Father   . Pancreatitis  Maternal Grandmother   . Diabetes Maternal Grandfather   . Heart attack Maternal Grandfather   . Stomach cancer Paternal Grandfather   . Dementia Paternal Grandmother     SOCIAL HISTORY: Social History   Socioeconomic History  . Marital status: Widowed    Spouse name: Not on file  . Number of children: 1  . Years of education: Masters  . Highest education level: Not on file  Occupational History  . Occupation: Retired  Tobacco Use  . Smoking status: Former Smoker    Types: Cigarettes    Quit date: 10/11/1988    Years since quitting: 31.5  . Smokeless tobacco: Never Used  . Tobacco comment: Quit 1988  Substance and Sexual Activity  . Alcohol use: Yes    Alcohol/week: 0.0 standard drinks    Comment: Social   . Drug use: No  . Sexual activity: Not on file  Other Topics Concern  . Not on file  Social History Narrative   Lives at home alone.   Right-handed.   2-4 cups per week.   Social Determinants of Health   Financial Resource Strain:   . Difficulty of Paying Living Expenses:   Food Insecurity:   . Worried About Charity fundraiser in the Last Year:   . Arboriculturist in the Last Year:   Transportation Needs:   . Film/video editor (Medical):   Marland Kitchen Lack of Transportation (Non-Medical):   Physical Activity:   . Days of Exercise per Week:   . Minutes of Exercise per Session:   Stress:   . Feeling of Stress :   Social Connections:   . Frequency of Communication with Friends and Family:   . Frequency of Social Gatherings with Friends and Family:   . Attends Religious Services:   . Active Member of Clubs or Organizations:   . Attends Archivist Meetings:   Marland Kitchen Marital Status:   Intimate Partner Violence:   . Fear of Current or Ex-Partner:   . Emotionally Abused:   Marland Kitchen Physically Abused:   . Sexually Abused:      PHYSICAL EXAM   Vitals:   04/20/20 1452  BP: 128/71  Pulse: 64  Weight: 130 lb (59 kg)  Height: 5\' 2"  (1.575 m)    Not recorded      Body mass index is 23.78 kg/m.  PHYSICAL EXAMNIATION:  Gen: NAD, conversant, well nourised, well groomed                     Cardiovascular: Regular rate rhythm, no peripheral edema, warm, nontender. Eyes: Conjunctivae clear without exudates or hemorrhage Neck: Supple, no carotid bruits. Pulmonary: Clear to auscultation bilaterally   NEUROLOGICAL EXAM:   MMSE - Mini Mental State Exam 04/20/2020 04/01/2018 10/01/2017  Orientation to time 4 5 4   Orientation to Place 5 5 5   Registration 3 3 3   Attention/ Calculation 5 5 5   Recall 2 3 3   Language- name 2 objects 2 2 2   Language- repeat 1 1 1   Language- follow 3 step command 3 3 3   Language- read & follow direction 1 1 1   Write a sentence 1 1 1   Copy design  1 1 1   Total score 28 30 29      CRANIAL NERVES: CN II: Visual fields are full to confrontation. Pupils are round equal and briskly reactive to light. CN III, IV, VI: extraocular movement are normal. No ptosis. CN V: Facial sensation is intact to  light touch CN VII: Face is symmetric with normal eye closure  CN VIII: Hearing is normal to causal conversation. CN IX, X: Phonation is normal. CN XI: Head turning and shoulder shrug are intact  MOTOR: There is no pronator drift of out-stretched arms. Muscle bulk and tone are normal. Muscle strength is normal.  REFLEXES: Reflexes are 2+ and symmetric at the biceps, triceps, knees, and ankles. Plantar responses are flexor.  COORDINATION: There is no trunk or limb dysmetria noted.  GAIT/STANCE: Posture is normal. Gait is steady with normal steps, base, arm swing, and turning. Heel and toe walking are normal. Tandem gait is normal.  Romberg is absent.   DIAGNOSTIC DATA (LABS, IMAGING, TESTING) - I reviewed patient records, labs, notes, testing and imaging myself where available.   ASSESSMENT AND PLAN  Amanda Contreras is a 74 y.o. female    Mild cognitive impairment  Slight worsening,  Has difficulty using GPS, still lives alone with her dog  Aricept 10 mg daily, Namenda 10 mg twice a day  Return to clinic in 1 year    Marcial Pacas, M.D. Ph.D.  [facility] [address] [place], [place] [address] Ph: [phone] Fax: [phone]  CC: Referring Provider

## 2020-07-21 DIAGNOSIS — T63461A Toxic effect of venom of wasps, accidental (unintentional), initial encounter: Secondary | ICD-10-CM | POA: Diagnosis not present

## 2020-08-10 DIAGNOSIS — R4189 Other symptoms and signs involving cognitive functions and awareness: Secondary | ICD-10-CM | POA: Diagnosis not present

## 2020-08-10 DIAGNOSIS — Z23 Encounter for immunization: Secondary | ICD-10-CM | POA: Diagnosis not present

## 2021-03-22 DIAGNOSIS — Z961 Presence of intraocular lens: Secondary | ICD-10-CM | POA: Diagnosis not present

## 2021-03-22 DIAGNOSIS — H401132 Primary open-angle glaucoma, bilateral, moderate stage: Secondary | ICD-10-CM | POA: Diagnosis not present

## 2021-04-21 ENCOUNTER — Ambulatory Visit: Payer: Medicare PPO | Admitting: Neurology

## 2021-04-28 ENCOUNTER — Other Ambulatory Visit: Payer: Self-pay | Admitting: Family Medicine

## 2021-04-28 DIAGNOSIS — R300 Dysuria: Secondary | ICD-10-CM | POA: Diagnosis not present

## 2021-04-28 DIAGNOSIS — Z1211 Encounter for screening for malignant neoplasm of colon: Secondary | ICD-10-CM | POA: Diagnosis not present

## 2021-04-28 DIAGNOSIS — E041 Nontoxic single thyroid nodule: Secondary | ICD-10-CM | POA: Diagnosis not present

## 2021-04-28 DIAGNOSIS — M8588 Other specified disorders of bone density and structure, other site: Secondary | ICD-10-CM | POA: Diagnosis not present

## 2021-04-28 DIAGNOSIS — R4189 Other symptoms and signs involving cognitive functions and awareness: Secondary | ICD-10-CM | POA: Diagnosis not present

## 2021-04-28 DIAGNOSIS — E7800 Pure hypercholesterolemia, unspecified: Secondary | ICD-10-CM | POA: Diagnosis not present

## 2021-04-28 DIAGNOSIS — E559 Vitamin D deficiency, unspecified: Secondary | ICD-10-CM | POA: Diagnosis not present

## 2021-04-28 DIAGNOSIS — Z0000 Encounter for general adult medical examination without abnormal findings: Secondary | ICD-10-CM | POA: Diagnosis not present

## 2021-04-28 DIAGNOSIS — Z1231 Encounter for screening mammogram for malignant neoplasm of breast: Secondary | ICD-10-CM | POA: Diagnosis not present

## 2021-04-28 DIAGNOSIS — Z23 Encounter for immunization: Secondary | ICD-10-CM | POA: Diagnosis not present

## 2021-04-29 DIAGNOSIS — Z0000 Encounter for general adult medical examination without abnormal findings: Secondary | ICD-10-CM | POA: Diagnosis not present

## 2021-05-16 DIAGNOSIS — Z1159 Encounter for screening for other viral diseases: Secondary | ICD-10-CM | POA: Diagnosis not present

## 2021-05-16 DIAGNOSIS — Z20828 Contact with and (suspected) exposure to other viral communicable diseases: Secondary | ICD-10-CM | POA: Diagnosis not present

## 2021-05-30 DIAGNOSIS — Z20828 Contact with and (suspected) exposure to other viral communicable diseases: Secondary | ICD-10-CM | POA: Diagnosis not present

## 2021-05-30 DIAGNOSIS — Z1159 Encounter for screening for other viral diseases: Secondary | ICD-10-CM | POA: Diagnosis not present

## 2021-06-03 ENCOUNTER — Other Ambulatory Visit: Payer: Self-pay | Admitting: Family Medicine

## 2021-06-03 DIAGNOSIS — E041 Nontoxic single thyroid nodule: Secondary | ICD-10-CM

## 2021-06-20 ENCOUNTER — Telehealth: Payer: Self-pay | Admitting: Neurology

## 2021-06-20 DIAGNOSIS — Z1159 Encounter for screening for other viral diseases: Secondary | ICD-10-CM | POA: Diagnosis not present

## 2021-06-20 DIAGNOSIS — Z20828 Contact with and (suspected) exposure to other viral communicable diseases: Secondary | ICD-10-CM | POA: Diagnosis not present

## 2021-06-20 NOTE — Telephone Encounter (Signed)
Pt's daughter, Wayne Sever (on [place]) called, mother have moved to [facility] facility. Concerned about her driving, would like Dr. Krista Blue to address her not being able to drive during her appointment tomorrow. Feel like it would better coming from her neurologist. Would like a call from the nurse.

## 2021-06-20 NOTE — Progress Notes (Signed)
PATIENT: Amanda Contreras DOB: Oct 07, 1946  Chief Complaint  Patient presents with   Follow-up    Room 12 w/ her friend, Amanda Contreras. The patient has moved to a retirement community ([facility]). Feels her memory is stable.    HISTORICAL  Amanda Contreras is a 75 years old right-handed female, seen in refer by  primary care physician [name], MD in September 23 2015 for evaluation of memory loss, she is accompanied by her cousin Amanda Contreras at today's clinical visit.Initial evaluation was in October 2016.   She has past medical history of sensory neuronopathy hearing loss since 2012, hyperlipidemia, previous smoker, quit in 1989,   She had master degree, majored in [place] education, was a retired Pharmacist, hospital at age 58, she was widowed since 2012, also took care of her mother afterwards, who passed away in Spring of 2015.   I saw her previously for double vision in 2013, which has resolved, I personally reviewed MRI of the brain January 2013, mild small vessel disease, generalized atrophy.   Since spring of 2015, she was noted to have word finding difficulties, sometimes driving familiar route, she has to think hard to get to the destination, once she was lost in her urologist office. Still managing her own bill, lives with her dog at her household, forgetting appointments sometimes, she also complains of difficulty falling to sleep, not eating regularly, she is also on diet, lost over 30 pounds since 2015.   Her paternal grandmother suffered dementia.   Update December 27 2015: She was started on Namenda 10 mg twice a day since October 2016, She tolerates it well. She still has mild memory loss, she lives by her self, she still drives, " have to think where I am going",  She has GPS, she has trouble keep things organized,  She eats well, mild insomnia, tends to sleep late.     She is seeing an elder care attorney, financial consultation, which has added on pressure to her.   UPDATE Sept  28th 2017: She is accompanied by her daughter and 2 cousins at today's clinical visit   we have evaluate her neuropsychological evaluation by Dr. Vikki Contreras," profile considered together with the description of her everyday functioning would be consistent with Mild Cognitive Impairment, primarily amnestic type. Her relatively intact attentional and executive functioning coupled with her insight into her memory difficulties would bode well for her potential to maintain independent living"   She continue complains of progressive worsening memory loss, her daughter has made some arrangements to clean out her house, potential transition into  friend's home assistant living in 4 years,    Her father suffered Parkinson's disease, memory loss, paternal grandmother has memory loss.   UPDATE March 29 2017: She is with her daughter, and 2 cousins Amanda Contreras and Amanda Contreras at today's clinical visit, she is noted to be more anxious, increased difficulty on daily activity, has to be reminded about taking her medications, also has difficulty staying alone at evening time, make phone calls to her cousins in the middle of the night   She is taking Namenda 10 mg twice a day, did not try Aricept worry about the side effects, [facility]   UPDATE Oct 01 2017: She came in with her legal guardian Mr. Amanda Contreras, her cousin Amanda Contreras, she was seen by Amanda Contreras, August 03 2017, confirm her diagnosis of mild , was also given the prescription of Remeron  7.5 mg every night for anxiety   She lives in her house since 2012, with her dog, there was a prolonged discussion of potential move to assisted living at friend's home, but patient seems to be very resistant to the idea, she still driving short distance, but has very irregular eating habit, have friends and relatives check on her regularly.   UPDATE April 01 2018: She is here with her former husband, now friend and power of attorney  Mr. Amanda Contreras, she continues to have slow worsening memory loss, complains of naming difficulty, she lives independently, family struggled to persuade her to move to a supervised environment   Virtual Visit via Video  08/12/2019:   She is overall stable, continue to be physically active, tolerating Namenda 10 mg twice a day, no longer taking Remeron she exercise regularly  UPDATE Apr 20 2020: She is accompanied by her friend Amanda Contreras at today's visit, she is overall doing well, missing her medication sometimes, she enjoys spending time with her dog,  She has more trouble finding direction, has difficulty using GPS, laboratory evaluation in October 2020, LDL 149, normal TSH 1.78, normal CMP, creatinine of 0.69, B12 of 480  I personally reviewed her last MRI of in 2013,  Update June 21, 2021 SS: Has moved to [facility] independent living for 5 weeks now, here today with friend Amanda Contreras. Moved due to wanting more social interaction. Has word finding trouble, otherwise denies issues. Manages her finances, medications. Keeps her apartment. 1 time recently was driving, realized she forgot to look left, her family and friends are concerned about her driving. MMSE 26/30. Walks her dog twice daily.   REVIEW OF SYSTEMS: Full 14 system review of systems performed and notable only for as above  See HPI  ALLERGIES: Allergies  Allergen Reactions   Hydrocodone Other (See Comments)    dizziness   Latex Rash   Sulfa Antibiotics Rash    HOME MEDICATIONS: Current Outpatient Medications  Medication Sig Dispense Refill   donepezil (ARICEPT) 10 MG tablet Take 1 tablet (10 mg total) by mouth at bedtime. 90 tablet 4   memantine (NAMENDA) 10 MG tablet Take 1 tablet (10 mg total) by mouth 2 (two) times daily. 180 tablet 4   Multiple Vitamins-Minerals (PRESERVISION AREDS 2 PO) Take 1 tablet by mouth 2 (two) times daily.      No current facility-administered medications for this visit.    PAST MEDICAL  HISTORY: Past Medical History:  Diagnosis Date   Arthritis    Genital prolapse    Heart murmur    Hypercholesteremia     PAST SURGICAL HISTORY: Past Surgical History:  Procedure Laterality Date   ANTERIOR AND POSTERIOR REPAIR N/A 10/19/2015   Procedure: ANTERIOR (CYSTOCELE) ;  Surgeon: Bjorn Loser, MD;  Location: [facility];  Service: Urology;  Laterality: N/A;   CYSTOSCOPY N/A 10/19/2015   Procedure: CYSTOSCOPY;  Surgeon: Bjorn Loser, MD;  Location: [facility];  Service: Urology;  Laterality: N/A;   DILATATION & CURETTAGE/HYSTEROSCOPY WITH MYOSURE N/A 09/02/2015   Procedure: DILATATION & CURETTAGE/HYSTEROSCOPY WITH MYOSURE;  Surgeon: Servando Salina, MD;  Location: [facility];  Service: Gynecology;  Laterality: N/A;   PUBOVAGINAL SLING N/A 10/19/2015   Procedure: Inetta Fermo  ;  Surgeon: Bjorn Loser, MD;  [facility];  Service: Urology;  Laterality: N/A;   SALPINGOOPHORECTOMY Bilateral 10/19/2015   Procedure: BILATERAL SALPINGO OOPHORECTOMY;  Surgeon: Servando Salina, MD;  Location: [facility];  Service: Gynecology;  Laterality: Bilateral;   Uterine/Bladder Prolapse Surgery  Scheduled 10/19/15   VAGINAL HYSTERECTOMY N/A 10/19/2015   Procedure: HYSTERECTOMY VAGINAL;  Surgeon: Servando Salina, MD;  [facility]: [facility];  Service: Gynecology;  Laterality: N/A;    FAMILY HISTORY: Family History  Problem Relation Age of Onset   Parkinson's disease Father    Pancreatitis Maternal Grandmother    Diabetes Maternal Grandfather    Heart attack Maternal Grandfather    Stomach cancer Paternal Grandfather    Dementia Paternal Grandmother     SOCIAL HISTORY: Social History   Socioeconomic History   Marital status: Widowed    Spouse name: Not on file   Number of children: 1   Years of education: Masters   Highest education level: Not on file  Occupational History   Occupation: Retired  Tobacco Use   Smoking status: Former    Types: Cigarettes    Quit date: 10/11/1988     Years since quitting: 32.7   Smokeless tobacco: Never   Tobacco comments:    Quit 1988  Substance and Sexual Activity   Alcohol use: Yes    Alcohol/week: 0.0 standard drinks    Comment: Social   Drug use: No   Sexual activity: Not on file  Other Topics Concern   Not on file  Social History Narrative   Lives at home alone.   Right-handed.   2-4 cups per week.   Social Determinants of Health   Financial Resource Strain: Not on file  Food Insecurity: Not on file  Transportation Needs: Not on file  Physical Activity: Not on file  Stress: Not on file  Social Connections: Not on file  Intimate Partner Violence: Not on file   PHYSICAL EXAM   Vitals:   06/21/21 1335  Weight: 123 lb 8 oz (56 kg)  Height: 5\' 2"  (1.575 m)    Not recorded     Body mass index is 22.59 kg/m.  MMSE - Mini Mental State Exam 06/21/2021 04/20/2020 04/01/2018  Orientation to time 4 4 5   Orientation to Place 5 5 5   Registration 3 3 3   Attention/ Calculation 5 5 5   Recall 0 2 3  Language- name 2 objects 2 2 2   Language- repeat 1 1 1   Language- follow 3 step command 3 3 3   Language- read & follow direction 1 1 1   Write a sentence 1 1 1   Copy design 1 1 1   Copy design-comments 4-legged animals x 1 minute: 6 - -  Total score 26 28 30    Physical Exam  General: The patient is alert and cooperative at the time of the examination.  Skin: No significant peripheral edema is noted.  Neurologic Exam  Mental status: The patient is alert and oriented x 3 at the time of the examination. The patient has apparent normal recent and remote memory, with an apparently normal attention span and concentration ability.  Cranial nerves: Facial symmetry is present. Speech is normal, no aphasia or dysarthria is noted. Extraocular movements are full. Visual fields are full.  Motor: The patient has good strength in all 4 extremities.  Sensory examination: Soft touch sensation is symmetric on the face, arms,  and legs.  Coordination: The patient has good finger-nose-finger and heel-to-shin bilaterally.  Gait and station: The patient has a normal gait.   Reflexes: Deep tendon reflexes are symmetric.  DIAGNOSTIC DATA (LABS, IMAGING, TESTING) - I reviewed patient records, labs, notes, testing and imaging myself where available.  ASSESSMENT AND PLAN  Amanda Contreras is a 75 y.o. female  Mild cognitive impairment -MMSE 26/30, has moved into independent living, stable clinically  -referral to OT for driving evaluation, concern from daughter, friends; hold off driving until evaluation is complete, other option is [facility] -Continue Aricept and Namenda  -Encouraged to continue exercise -Follow-up in 6 to 8 months or sooner if needed  I spent 34 minutes of face-to-face and non-face-to-face time with patient.  This included previsit chart review, lab review, study review, order entry, discussing driving, medications, management, referral to OT, and follow-up.   Evangeline Dakin, DNP  [facility] [address] [place], [place] [address] [phone]

## 2021-06-21 ENCOUNTER — Encounter: Payer: Self-pay | Admitting: Neurology

## 2021-06-21 ENCOUNTER — Ambulatory Visit: Payer: Medicare PPO | Admitting: Neurology

## 2021-06-21 VITALS — Ht 62.0 in | Wt 123.5 lb

## 2021-06-21 DIAGNOSIS — R4189 Other symptoms and signs involving cognitive functions and awareness: Secondary | ICD-10-CM | POA: Diagnosis not present

## 2021-06-21 MED ORDER — MEMANTINE HCL 10 MG PO TABS: 10.0000 mg | ORAL_TABLET | Freq: Two times a day (BID) | ORAL | 4 refills | Status: DC

## 2021-06-21 MED ORDER — DONEPEZIL HCL 10 MG PO TABS: 10.0000 mg | ORAL_TABLET | Freq: Every day | ORAL | 4 refills | Status: DC

## 2021-06-21 NOTE — Patient Instructions (Addendum)
Continue current medications Referral for driving evaluation Return in 6-8 months

## 2021-06-27 DIAGNOSIS — Z20828 Contact with and (suspected) exposure to other viral communicable diseases: Secondary | ICD-10-CM | POA: Diagnosis not present

## 2021-06-27 DIAGNOSIS — Z1159 Encounter for screening for other viral diseases: Secondary | ICD-10-CM | POA: Diagnosis not present

## 2021-07-04 DIAGNOSIS — Z1159 Encounter for screening for other viral diseases: Secondary | ICD-10-CM | POA: Diagnosis not present

## 2021-07-04 DIAGNOSIS — Z20828 Contact with and (suspected) exposure to other viral communicable diseases: Secondary | ICD-10-CM | POA: Diagnosis not present

## 2021-07-13 NOTE — Telephone Encounter (Signed)
Referral sent to [facility] at [place], [place],  [address]. Phone: [phone].

## 2021-07-20 DIAGNOSIS — Z20828 Contact with and (suspected) exposure to other viral communicable diseases: Secondary | ICD-10-CM | POA: Diagnosis not present

## 2021-07-25 DIAGNOSIS — Z20828 Contact with and (suspected) exposure to other viral communicable diseases: Secondary | ICD-10-CM | POA: Diagnosis not present

## 2021-08-01 DIAGNOSIS — Z8616 Personal history of COVID-19: Secondary | ICD-10-CM | POA: Diagnosis not present

## 2021-08-08 DIAGNOSIS — Z8616 Personal history of COVID-19: Secondary | ICD-10-CM | POA: Diagnosis not present

## 2021-08-15 DIAGNOSIS — Z8616 Personal history of COVID-19: Secondary | ICD-10-CM | POA: Diagnosis not present

## 2021-08-22 DIAGNOSIS — Z20828 Contact with and (suspected) exposure to other viral communicable diseases: Secondary | ICD-10-CM | POA: Diagnosis not present

## 2021-09-05 DIAGNOSIS — Z8616 Personal history of COVID-19: Secondary | ICD-10-CM | POA: Diagnosis not present

## 2021-09-12 DIAGNOSIS — Z20828 Contact with and (suspected) exposure to other viral communicable diseases: Secondary | ICD-10-CM | POA: Diagnosis not present

## 2021-09-19 DIAGNOSIS — Z20828 Contact with and (suspected) exposure to other viral communicable diseases: Secondary | ICD-10-CM | POA: Diagnosis not present

## 2021-09-26 DIAGNOSIS — Z20828 Contact with and (suspected) exposure to other viral communicable diseases: Secondary | ICD-10-CM | POA: Diagnosis not present

## 2021-10-03 DIAGNOSIS — Z8616 Personal history of COVID-19: Secondary | ICD-10-CM | POA: Diagnosis not present

## 2021-10-10 DIAGNOSIS — Z20822 Contact with and (suspected) exposure to covid-19: Secondary | ICD-10-CM | POA: Diagnosis not present

## 2021-10-17 DIAGNOSIS — Z20822 Contact with and (suspected) exposure to covid-19: Secondary | ICD-10-CM | POA: Diagnosis not present

## 2021-10-24 DIAGNOSIS — Z20822 Contact with and (suspected) exposure to covid-19: Secondary | ICD-10-CM | POA: Diagnosis not present

## 2021-10-31 DIAGNOSIS — Z1159 Encounter for screening for other viral diseases: Secondary | ICD-10-CM | POA: Diagnosis not present

## 2021-10-31 DIAGNOSIS — Z20828 Contact with and (suspected) exposure to other viral communicable diseases: Secondary | ICD-10-CM | POA: Diagnosis not present

## 2021-11-07 DIAGNOSIS — Z20828 Contact with and (suspected) exposure to other viral communicable diseases: Secondary | ICD-10-CM | POA: Diagnosis not present

## 2021-11-07 DIAGNOSIS — Z1159 Encounter for screening for other viral diseases: Secondary | ICD-10-CM | POA: Diagnosis not present

## 2021-11-14 DIAGNOSIS — Z20828 Contact with and (suspected) exposure to other viral communicable diseases: Secondary | ICD-10-CM | POA: Diagnosis not present

## 2021-11-14 DIAGNOSIS — Z1159 Encounter for screening for other viral diseases: Secondary | ICD-10-CM | POA: Diagnosis not present

## 2021-11-16 DIAGNOSIS — Z1159 Encounter for screening for other viral diseases: Secondary | ICD-10-CM | POA: Diagnosis not present

## 2021-11-16 DIAGNOSIS — Z20828 Contact with and (suspected) exposure to other viral communicable diseases: Secondary | ICD-10-CM | POA: Diagnosis not present

## 2021-11-18 DIAGNOSIS — Z1159 Encounter for screening for other viral diseases: Secondary | ICD-10-CM | POA: Diagnosis not present

## 2021-11-18 DIAGNOSIS — Z20828 Contact with and (suspected) exposure to other viral communicable diseases: Secondary | ICD-10-CM | POA: Diagnosis not present

## 2021-11-21 DIAGNOSIS — Z1159 Encounter for screening for other viral diseases: Secondary | ICD-10-CM | POA: Diagnosis not present

## 2021-11-21 DIAGNOSIS — Z20828 Contact with and (suspected) exposure to other viral communicable diseases: Secondary | ICD-10-CM | POA: Diagnosis not present

## 2021-11-23 DIAGNOSIS — Z20828 Contact with and (suspected) exposure to other viral communicable diseases: Secondary | ICD-10-CM | POA: Diagnosis not present

## 2021-11-23 DIAGNOSIS — Z1159 Encounter for screening for other viral diseases: Secondary | ICD-10-CM | POA: Diagnosis not present

## 2021-11-28 DIAGNOSIS — Z20828 Contact with and (suspected) exposure to other viral communicable diseases: Secondary | ICD-10-CM | POA: Diagnosis not present

## 2021-11-28 DIAGNOSIS — Z1159 Encounter for screening for other viral diseases: Secondary | ICD-10-CM | POA: Diagnosis not present

## 2021-11-30 DIAGNOSIS — Z1159 Encounter for screening for other viral diseases: Secondary | ICD-10-CM | POA: Diagnosis not present

## 2021-11-30 DIAGNOSIS — Z20828 Contact with and (suspected) exposure to other viral communicable diseases: Secondary | ICD-10-CM | POA: Diagnosis not present

## 2021-12-05 DIAGNOSIS — Z20822 Contact with and (suspected) exposure to covid-19: Secondary | ICD-10-CM | POA: Diagnosis not present

## 2021-12-07 DIAGNOSIS — Z20828 Contact with and (suspected) exposure to other viral communicable diseases: Secondary | ICD-10-CM | POA: Diagnosis not present

## 2021-12-07 DIAGNOSIS — Z1159 Encounter for screening for other viral diseases: Secondary | ICD-10-CM | POA: Diagnosis not present

## 2021-12-08 DIAGNOSIS — H353132 Nonexudative age-related macular degeneration, bilateral, intermediate dry stage: Secondary | ICD-10-CM | POA: Diagnosis not present

## 2021-12-08 DIAGNOSIS — H5201 Hypermetropia, right eye: Secondary | ICD-10-CM | POA: Diagnosis not present

## 2021-12-08 DIAGNOSIS — H3530 Unspecified macular degeneration: Secondary | ICD-10-CM | POA: Diagnosis not present

## 2021-12-08 DIAGNOSIS — H401131 Primary open-angle glaucoma, bilateral, mild stage: Secondary | ICD-10-CM | POA: Diagnosis not present

## 2021-12-08 DIAGNOSIS — H26493 Other secondary cataract, bilateral: Secondary | ICD-10-CM | POA: Diagnosis not present

## 2021-12-08 DIAGNOSIS — H524 Presbyopia: Secondary | ICD-10-CM | POA: Diagnosis not present

## 2021-12-08 DIAGNOSIS — H52223 Regular astigmatism, bilateral: Secondary | ICD-10-CM | POA: Diagnosis not present

## 2021-12-12 DIAGNOSIS — Z20822 Contact with and (suspected) exposure to covid-19: Secondary | ICD-10-CM | POA: Diagnosis not present

## 2021-12-14 DIAGNOSIS — Z20828 Contact with and (suspected) exposure to other viral communicable diseases: Secondary | ICD-10-CM | POA: Diagnosis not present

## 2021-12-14 DIAGNOSIS — Z1159 Encounter for screening for other viral diseases: Secondary | ICD-10-CM | POA: Diagnosis not present

## 2021-12-19 DIAGNOSIS — Z20828 Contact with and (suspected) exposure to other viral communicable diseases: Secondary | ICD-10-CM | POA: Diagnosis not present

## 2021-12-26 DIAGNOSIS — Z20822 Contact with and (suspected) exposure to covid-19: Secondary | ICD-10-CM | POA: Diagnosis not present

## 2021-12-27 NOTE — Progress Notes (Deleted)
PATIENT: Amanda Contreras DOB: 04-25-1946  No chief complaint on file.   HISTORICAL  Amanda Contreras is a 76 years old right-handed female, seen in refer by  primary care physician [name], MD in September 23 2015 for evaluation of memory loss, she is accompanied by her cousin [name] at today's clinical visit.Initial evaluation was in October 2016.   She has past medical history of sensory neuronopathy hearing loss since 2012, hyperlipidemia, previous smoker, quit in 1989,   She had master degree, majored in [place] education, was a retired Pharmacist, hospital at age 64, she was widowed since 2012, also took care of her mother afterwards, who passed away in Spring of 2015.   I saw her previously for double vision in 2013, which has resolved, I personally reviewed MRI of the brain January 2013, mild small vessel disease, generalized atrophy.   Since spring of 2015, she was noted to have word finding difficulties, sometimes driving familiar route, she has to think hard to get to the destination, once she was lost in her urologist office. Still managing her own bill, lives with her dog at her household, forgetting appointments sometimes, she also complains of difficulty falling to sleep, not eating regularly, she is also on diet, lost over 30 pounds since 2015.   Her paternal grandmother suffered dementia.   Update December 27 2015: She was started on Namenda 10 mg twice a day since October 2016, She tolerates it well. She still has mild memory loss, she lives by her self, she still drives, " have to think where I am going",  She has GPS, she has trouble keep things organized,  She eats well, mild insomnia, tends to sleep late.     She is seeing an elder care attorney, financial consultation, which has added on pressure to her.   UPDATE Sept 28th 2017: She is accompanied by her daughter and 2 cousins at today's clinical visit   we have evaluate her neuropsychological evaluation by Dr.  [name]," profile considered together with the description of her everyday functioning would be consistent with Mild Cognitive Impairment, primarily amnestic type. Her relatively intact attentional and executive functioning coupled with her insight into her memory difficulties would bode well for her potential to maintain independent living"   She continue complains of progressive worsening memory loss, her daughter has made some arrangements to clean out her house, potential transition into  friend's home assistant living in 4 years,    Her father suffered Parkinson's disease, memory loss, paternal grandmother has memory loss.   UPDATE March 29 2017: She is with her daughter, and 2 cousins [name] and [name] at today's clinical visit, she is noted to be more anxious, increased difficulty on daily activity, has to be reminded about taking her medications, also has difficulty staying alone at evening time, make phone calls to her cousins in the middle of the night   She is taking Namenda 10 mg twice a day, did not try Aricept worry about the side effects, [facility]   UPDATE Oct 01 2017: She came in with her legal guardian Mr. [name], her cousin [name], she was seen by [name], August 03 2017, confirm her diagnosis of mild , was also given the prescription of Remeron 7.5 mg every night for anxiety   She lives in her house since 2012, with her dog, there was a prolonged discussion of potential move to assisted living  at friend's home, but patient seems to be very resistant to the idea, she still driving short distance, but has very irregular eating habit, have friends and relatives check on her regularly.   UPDATE April 01 2018: She is here with her former husband, now friend and power of attorney Mr. [name], she continues to have slow worsening memory loss, complains of naming difficulty, she lives independently, family struggled to  persuade her to move to a supervised environment   Virtual Visit via Video  08/12/2019:   She is overall stable, continue to be physically active, tolerating Namenda 10 mg twice a day, no longer taking Remeron she exercise regularly  UPDATE Apr 20 2020: She is accompanied by her friend [name] at today's visit, she is overall doing well, missing her medication sometimes, she enjoys spending time with her dog,  She has more trouble finding direction, has difficulty using GPS, laboratory evaluation in October 2020, LDL 149, normal TSH 1.78, normal CMP, creatinine of 0.69, B12 of 480  I personally reviewed her last MRI of in 2013,  Update June 21, 2021 SS: Has moved to [facility] independent living for 5 weeks now, here today with friend [name]. Moved due to wanting more social interaction. Has word finding trouble, otherwise denies issues. Manages her finances, medications. Keeps her apartment. 1 time recently was driving, realized she forgot to look left, her family and friends are concerned about her driving. MMSE 26/30. Walks her dog twice daily.   Update December 28, 2021 SS:   REVIEW OF SYSTEMS: Full 14 system review of systems performed and notable only for as above  See HPI  ALLERGIES: Allergies  Allergen Reactions   Hydrocodone Other (See Comments)    dizziness   Latex Rash   Sulfa Antibiotics Rash    HOME MEDICATIONS: Current Outpatient Medications  Medication Sig Dispense Refill   donepezil (ARICEPT) 10 MG tablet Take 1 tablet (10 mg total) by mouth at bedtime. 90 tablet 4   memantine (NAMENDA) 10 MG tablet Take 1 tablet (10 mg total) by mouth 2 (two) times daily. 180 tablet 4   Multiple Vitamins-Minerals (PRESERVISION AREDS 2 PO) Take 1 tablet by mouth 2 (two) times daily.      No current facility-administered medications for this visit.    PAST MEDICAL HISTORY: Past Medical History:  Diagnosis Date   Arthritis    Genital prolapse    Heart murmur     Hypercholesteremia     PAST SURGICAL HISTORY: Past Surgical History:  Procedure Laterality Date   ANTERIOR AND POSTERIOR REPAIR N/A 10/19/2015   Procedure: ANTERIOR (CYSTOCELE) ;  Surgeon: Bjorn Loser, MD;  Location: [facility];  Service: Urology;  Laterality: N/A;   CYSTOSCOPY N/A 10/19/2015   Procedure: CYSTOSCOPY;  Surgeon: Bjorn Loser, MD;  Location: [facility];  Service: Urology;  Laterality: N/A;   DILATATION & CURETTAGE/HYSTEROSCOPY WITH MYOSURE N/A 09/02/2015   Procedure: DILATATION & CURETTAGE/HYSTEROSCOPY WITH MYOSURE;  Surgeon: Servando Salina, MD;  Location: [facility];  Service: Gynecology;  Laterality: N/A;   PUBOVAGINAL SLING N/A 10/19/2015   Procedure: Inetta Fermo  ;  Surgeon: Bjorn Loser, MD;  Location: [facility];  Service: Urology;  Laterality: N/A;   SALPINGOOPHORECTOMY Bilateral 10/19/2015   Procedure: BILATERAL SALPINGO OOPHORECTOMY;  Surgeon: Servando Salina, MD;  Location: [facility];  Service: Gynecology;  Laterality: Bilateral;   Uterine/Bladder Prolapse Surgery     Scheduled 10/19/15   VAGINAL HYSTERECTOMY N/A 10/19/2015   Procedure: HYSTERECTOMY VAGINAL;  Surgeon: Servando Salina, MD;  Location: [facility];  Service: Gynecology;  Laterality: N/A;    FAMILY HISTORY: Family History  Problem Relation Age of Onset   Parkinson's disease Father    Pancreatitis Maternal Grandmother    Diabetes Maternal Grandfather    Heart attack Maternal Grandfather    Stomach cancer Paternal Grandfather    Dementia Paternal Grandmother     SOCIAL HISTORY: Social History   Socioeconomic History   Marital status: Widowed    Spouse name: Not on file   Number of children: 1   Years of education: Masters   Highest education level: Not on file  Occupational History   Occupation: Retired  Tobacco Use   Smoking status: Former    Types: Cigarettes    Quit date: 10/11/1988    Years since quitting: 33.2   Smokeless tobacco: Never   Tobacco comments:    Quit 1988   Substance and Sexual Activity   Alcohol use: Yes    Alcohol/week: 0.0 standard drinks    Comment: Social   Drug use: No   Sexual activity: Not on file  Other Topics Concern   Not on file  Social History Narrative   Lives at home alone.   Right-handed.   2-4 cups per week.   Social Determinants of Health   Financial Resource Strain: Not on file  Food Insecurity: Not on file  Transportation Needs: Not on file  Physical Activity: Not on file  Stress: Not on file  Social Connections: Not on file  Intimate Partner Violence: Not on file   PHYSICAL EXAM   There were no vitals filed for this visit.   Not recorded     There is no height or weight on file to calculate BMI.  MMSE - Mini Mental State Exam 06/21/2021 04/20/2020 04/01/2018  Orientation to time 4 4 5   Orientation to Place 5 5 5   Registration 3 3 3   Attention/ Calculation 5 5 5   Recall 0 2 3  Language- name 2 objects 2 2 2   Language- repeat 1 1 1   Language- follow 3 step command 3 3 3   Language- read & follow direction 1 1 1   Write a sentence 1 1 1   Copy design 1 1 1   Copy design-comments 4-legged animals x 1 minute: 6 - -  Total score 26 28 30    Physical Exam  General: The patient is alert and cooperative at the time of the examination.  Skin: No significant peripheral edema is noted.  Neurologic Exam  Mental status: The patient is alert and oriented x 3 at the time of the examination. The patient has apparent normal recent and remote memory, with an apparently normal attention span and concentration ability.  Cranial nerves: Facial symmetry is present. Speech is normal, no aphasia or dysarthria is noted. Extraocular movements are full. Visual fields are full.  Motor: The patient has good strength in all 4 extremities.  Sensory examination: Soft touch sensation is symmetric on the face, arms, and legs.  Coordination: The patient has good finger-nose-finger and heel-to-shin bilaterally.  Gait and  station: The patient has a normal gait.   Reflexes: Deep tendon reflexes are symmetric.  DIAGNOSTIC DATA (LABS, IMAGING, TESTING) - I reviewed patient records, labs, notes, testing and imaging myself where available.  ASSESSMENT AND PLAN  BRANDAN GLAUBER is a 76 y.o. female    Mild cognitive impairment -MMSE 26/30, has moved into independent living, stable clinically  -referral to OT for driving evaluation, concern from daughter, friends; hold  off driving until evaluation is complete, other option is through [facility] -Continue Aricept and Namenda  -Encouraged to continue exercise -Follow-up in 6 to 8 months or sooner if needed     Butler Denmark, Laqueta Jean, DNP  [facility] [address], [place], [place] [address] [phone]

## 2021-12-28 ENCOUNTER — Encounter: Payer: Self-pay | Admitting: Neurology

## 2021-12-28 ENCOUNTER — Ambulatory Visit: Payer: Medicare PPO | Admitting: Neurology

## 2022-04-24 ENCOUNTER — Ambulatory Visit: Payer: Medicare PPO | Admitting: Neurology

## 2022-04-24 ENCOUNTER — Encounter: Payer: Self-pay | Admitting: Neurology

## 2022-04-24 VITALS — BP 117/74 | HR 58 | Ht 68.0 in | Wt 123.0 lb

## 2022-04-24 DIAGNOSIS — R4189 Other symptoms and signs involving cognitive functions and awareness: Secondary | ICD-10-CM

## 2022-04-24 MED ORDER — DONEPEZIL HCL 10 MG PO TABS: 10.0000 mg | ORAL_TABLET | Freq: Every day | ORAL | 4 refills | Status: DC

## 2022-04-24 MED ORDER — MEMANTINE HCL 10 MG PO TABS: 10.0000 mg | ORAL_TABLET | Freq: Two times a day (BID) | ORAL | 4 refills | Status: DC

## 2022-04-24 NOTE — Patient Instructions (Signed)
Meds ordered this encounter  Medications   memantine (NAMENDA) 10 MG tablet    Sig: Take 1 tablet (10 mg total) by mouth 2 (two) times daily.    Dispense:  180 tablet    Refill:  4   donepezil (ARICEPT) 10 MG tablet    Sig: Take 1 tablet (10 mg total) by mouth at bedtime.    Dispense:  90 tablet    Refill:  4   Check into getting more help with medications Recommend driving evaluation, we talked about concern for independent driving See you back in 6 months

## 2022-04-24 NOTE — Progress Notes (Signed)
PATIENT: Amanda Contreras DOB: 08-12-1946  Chief Complaint  Patient presents with   New Patient (Initial Visit)    Rm 15. Accompanied by friend, Ceclia. Cognitive impairment f/u. MMSE 19/30.   HISTORICAL  Amanda Contreras is a 76 years old right-handed female, seen in refer by  primary care physician [name], MD in September 23 2015 for evaluation of memory loss, she is accompanied by her cousin [name] at today's clinical visit.Initial evaluation was in October 2016.   She has past medical history of sensory neuronopathy hearing loss since 2012, hyperlipidemia, previous smoker, quit in 1989,   She had master degree, majored in [place] education, was a retired Pharmacist, hospital at age 38, she was widowed since 2012, also took care of her mother afterwards, who passed away in Spring of 2015.   I saw her previously for double vision in 2013, which has resolved, I personally reviewed MRI of the brain January 2013, mild small vessel disease, generalized atrophy.   Since spring of 2015, she was noted to have word finding difficulties, sometimes driving familiar route, she has to think hard to get to the destination, once she was lost in her urologist office. Still managing her own bill, lives with her dog at her household, forgetting appointments sometimes, she also complains of difficulty falling to sleep, not eating regularly, she is also on diet, lost over 30 pounds since 2015.   Her paternal grandmother suffered dementia.   Update December 27 2015: She was started on Namenda 10 mg twice a day since October 2016, She tolerates it well. She still has mild memory loss, she lives by her self, she still drives, " have to think where I am going",  She has GPS, she has trouble keep things organized,  She eats well, mild insomnia, tends to sleep late.     She is seeing an elder care attorney, financial consultation, which has added on pressure to her.   UPDATE Sept 28th 2017: She is accompanied by  her daughter and 2 cousins at today's clinical visit   we have evaluate her neuropsychological evaluation by Dr. [name]," profile considered together with the description of her everyday functioning would be consistent with Mild Cognitive Impairment, primarily amnestic type. Her relatively intact attentional and executive functioning coupled with her insight into her memory difficulties would bode well for her potential to maintain independent living"   She continue complains of progressive worsening memory loss, her daughter has made some arrangements to clean out her house, potential transition into  friend's home assistant living in 4 years,    Her father suffered Parkinson's disease, memory loss, paternal grandmother has memory loss.   UPDATE March 29 2017: She is with her daughter, and 2 cousins [name] and [name] at today's clinical visit, she is noted to be more anxious, increased difficulty on daily activity, has to be reminded about taking her medications, also has difficulty staying alone at evening time, make phone calls to her cousins in the middle of the night   She is taking Namenda 10 mg twice a day, did not try Aricept worry about the side effects, [facility]   UPDATE Oct 01 2017: She came in with her legal guardian Mr. [name], her cousin [name], she was seen by [name], August 03 2017, confirm her diagnosis of mild , was also given the prescription of Remeron 7.5 mg every night for anxiety   She  lives in her house since 2012, with her dog, there was a prolonged discussion of potential move to assisted living at friend's home, but patient seems to be very resistant to the idea, she still driving short distance, but has very irregular eating habit, have friends and relatives check on her regularly.   UPDATE April 01 2018: She is here with her former husband, now friend and power of attorney Mr. [name], she  continues to have slow worsening memory loss, complains of naming difficulty, she lives independently, family struggled to persuade her to move to a supervised environment   Virtual Visit via Video  08/12/2019:   She is overall stable, continue to be physically active, tolerating Namenda 10 mg twice a day, no longer taking Remeron she exercise regularly  UPDATE Apr 20 2020: She is accompanied by her friend [name] at today's visit, she is overall doing well, missing her medication sometimes, she enjoys spending time with her dog,  She has more trouble finding direction, has difficulty using GPS, laboratory evaluation in October 2020, LDL 149, normal TSH 1.78, normal CMP, creatinine of 0.69, B12 of 480  I personally reviewed her last MRI of in 2013,  Update June 21, 2021 SS: Has moved to [facility] independent living for 5 weeks now, here today with friend [name]. Moved due to wanting more social interaction. Has word finding trouble, otherwise denies issues. Manages her finances, medications. Keeps her apartment. 1 time recently was driving, realized she forgot to look left, her family and friends are concerned about her driving. MMSE 26/30. Walks her dog twice daily.   Update Apr 24, 2022 SS: MMSE 19/30 today. Here with friend, Suszanne Conners (is a Marine scientist). Feel memory worsening overtime. Realized here memory is slipping, based on memory testing, medication review. Is prescribed Aricept, Namenda, realized has not been taking, not sure how long? Thinks giving them to her dog.  Does short distance driving to the store/bank.  No issues thus far.  Has a housekeeper, gets 1 meal a day from facility. Has option for med help.  Daughter lives out of state. No falls, trouble with word finding.   REVIEW OF SYSTEMS: Full 14 system review of systems performed and notable only for as above  See HPI  ALLERGIES: Allergies  Allergen Reactions   Hydrocodone Other (See Comments)    dizziness   Latex Rash   Sulfa  Antibiotics Rash    HOME MEDICATIONS: Current Outpatient Medications  Medication Sig Dispense Refill   donepezil (ARICEPT) 10 MG tablet Take 1 tablet (10 mg total) by mouth at bedtime. 90 tablet 4   memantine (NAMENDA) 10 MG tablet Take 1 tablet (10 mg total) by mouth 2 (two) times daily. 180 tablet 4   Multiple Vitamins-Minerals (PRESERVISION AREDS 2 PO) Take 1 tablet by mouth 2 (two) times daily.  (Patient not taking: Reported on 04/24/2022)     No current facility-administered medications for this visit.    PAST MEDICAL HISTORY: Past Medical History:  Diagnosis Date   Arthritis    Genital prolapse    Heart murmur    Hypercholesteremia     PAST SURGICAL HISTORY: Past Surgical History:  Procedure Laterality Date   ANTERIOR AND POSTERIOR REPAIR N/A 10/19/2015   Procedure: ANTERIOR (CYSTOCELE) ;  Surgeon: Bjorn Loser, MD;  Location: [facility];  Service: Urology;  Laterality: N/A;   CYSTOSCOPY N/A 10/19/2015   Procedure: CYSTOSCOPY;  Surgeon: Bjorn Loser, MD;  Location: [facility];  Service: Urology;  Laterality: N/A;  DILATATION & CURETTAGE/HYSTEROSCOPY WITH MYOSURE N/A 09/02/2015   Procedure: DILATATION & CURETTAGE/HYSTEROSCOPY WITH MYOSURE;  Surgeon: Servando Salina, MD;  Location: [facility];  Service: Gynecology;  Laterality: N/A;   PUBOVAGINAL SLING N/A 10/19/2015   Procedure: Inetta Fermo  ;  Surgeon: Bjorn Loser, MD;  Location: [facility];  Service: Urology;  Laterality: N/A;   SALPINGOOPHORECTOMY Bilateral 10/19/2015   Procedure: BILATERAL SALPINGO OOPHORECTOMY;  Surgeon: Servando Salina, MD;  Location: [facility];  Service: Gynecology;  Laterality: Bilateral;   Uterine/Bladder Prolapse Surgery     Scheduled 10/19/15   VAGINAL HYSTERECTOMY N/A 10/19/2015   Procedure: HYSTERECTOMY VAGINAL;  Surgeon: Servando Salina, MD;  Location: [facility];  Service: Gynecology;  Laterality: N/A;    FAMILY HISTORY: Family History  Problem Relation Age of Onset   Parkinson's disease  Father    Pancreatitis Maternal Grandmother    Diabetes Maternal Grandfather    Heart attack Maternal Grandfather    Stomach cancer Paternal Grandfather    Dementia Paternal Grandmother     SOCIAL HISTORY: Social History   Socioeconomic History   Marital status: Widowed    Spouse name: Not on file   Number of children: 1   Years of education: Masters   Highest education level: Not on file  Occupational History   Occupation: Retired  Tobacco Use   Smoking status: Former    Types: Cigarettes    Quit date: 10/11/1988    Years since quitting: 33.5   Smokeless tobacco: Never   Tobacco comments:    Quit 1988  Substance and Sexual Activity   Alcohol use: Yes    Alcohol/week: 0.0 standard drinks    Comment: Social   Drug use: No   Sexual activity: Not on file  Other Topics Concern   Not on file  Social History Narrative   Lives at home alone.   Right-handed.   2-4 cups per week.   Social Determinants of Health   Financial Resource Strain: Not on file  Food Insecurity: Not on file  Transportation Needs: Not on file  Physical Activity: Not on file  Stress: Not on file  Social Connections: Not on file  Intimate Partner Violence: Not on file   PHYSICAL EXAM   Vitals:   04/24/22 1039  BP: 117/74  Pulse: (!) 58  Weight: 123 lb (55.8 kg)  Height: '5\' 8"'$  (1.727 m)   Not recorded    Body mass index is 18.7 kg/m.     04/24/2022   10:44 AM 06/21/2021    1:43 PM 04/20/2020    2:58 PM  MMSE - Mini Mental State Exam  Orientation to time '3 4 4  '$ Orientation to Place '4 5 5  '$ Registration '3 3 3  '$ Attention/ Calculation '2 5 5  '$ Recall 0 0 2  Language- name 2 objects '1 2 2  '$ Language- repeat '1 1 1  '$ Language- follow 3 step command '3 3 3  '$ Language- read & follow direction '1 1 1  '$ Write a sentence '1 1 1  '$ Copy design 0 1 1  Copy design-comments  4-legged animals x 1 minute: 6   Total score '19 26 28   '$ Physical Exam  General: The patient is alert and cooperative at the time  of the examination.  Skin: No significant peripheral edema is noted.  Neurologic Exam  Mental status: The patient is alert, provides her own history, repeats herself a few times, looks to friend for answers, mild word finding trouble  Cranial nerves: Facial symmetry is  present. Speech is normal, no aphasia or dysarthria is noted. Extraocular movements are full. Visual fields are full.  Motor: The patient has good strength in all 4 extremities.  Sensory examination: Soft touch sensation is symmetric on the face, arms, and legs.  Coordination: The patient has good finger-nose-finger and heel-to-shin bilaterally.  Gait and station: The patient has a normal gait.   Reflexes: Deep tendon reflexes are symmetric.  DIAGNOSTIC DATA (LABS, IMAGING, TESTING) - I reviewed patient records, labs, notes, testing and imaging myself where available.  ASSESSMENT AND PLAN  Amanda Contreras is a 76 y.o. female    Mild cognitive impairment -Memory decline 19/30 today (26/02 July 2021) -May be related to being off Aricept/ Namenda for unknown period of time, will get restarted, we discussed having more supervision with medications -Again discussed referral to OT for driving evaluation, provided list of resources and paperwork, we discussed when memory score is less than 24 we recommend against independent driving -Follow-up in our office in 6 months or sooner if needed  Butler Denmark, Laqueta Jean, [facility] [address] [place], [place] [address] [phone]

## 2022-04-25 ENCOUNTER — Telehealth: Payer: Self-pay | Admitting: Neurology

## 2022-04-25 MED ORDER — MEMANTINE HCL 10 MG PO TABS: 10.0000 mg | ORAL_TABLET | Freq: Two times a day (BID) | ORAL | 4 refills | Status: DC

## 2022-04-25 MED ORDER — DONEPEZIL HCL 10 MG PO TABS: 10.0000 mg | ORAL_TABLET | Freq: Every day | ORAL | 4 refills | Status: DC

## 2022-04-25 NOTE — Telephone Encounter (Signed)
Memantine and donepezil sent to [place] per request.

## 2022-04-25 NOTE — Telephone Encounter (Signed)
Pt's daughter, Wayne Sever would like to change pharmacy for medication. Would be easy for pt to drive and pick up medications at [facility] [address] [place], [place] [address] Phone: [phone]  Could you resend medication to the new pharmacy?

## 2022-09-21 DIAGNOSIS — R4189 Other symptoms and signs involving cognitive functions and awareness: Secondary | ICD-10-CM | POA: Diagnosis not present

## 2022-09-21 DIAGNOSIS — Z0000 Encounter for general adult medical examination without abnormal findings: Secondary | ICD-10-CM | POA: Diagnosis not present

## 2022-09-21 DIAGNOSIS — E041 Nontoxic single thyroid nodule: Secondary | ICD-10-CM | POA: Diagnosis not present

## 2022-09-21 DIAGNOSIS — E7800 Pure hypercholesterolemia, unspecified: Secondary | ICD-10-CM | POA: Diagnosis not present

## 2022-10-13 DIAGNOSIS — H353132 Nonexudative age-related macular degeneration, bilateral, intermediate dry stage: Secondary | ICD-10-CM | POA: Diagnosis not present

## 2022-11-02 ENCOUNTER — Encounter: Payer: Self-pay | Admitting: Neurology

## 2022-11-02 ENCOUNTER — Ambulatory Visit: Payer: Medicare PPO | Admitting: Neurology

## 2022-11-02 VITALS — BP 117/74 | HR 57 | Ht 62.0 in | Wt 127.0 lb

## 2022-11-02 DIAGNOSIS — G309 Alzheimer's disease, unspecified: Secondary | ICD-10-CM | POA: Diagnosis not present

## 2022-11-02 DIAGNOSIS — F02B0 Dementia in other diseases classified elsewhere, moderate, without behavioral disturbance, psychotic disturbance, mood disturbance, and anxiety: Secondary | ICD-10-CM | POA: Diagnosis not present

## 2022-11-02 MED ORDER — DONEPEZIL HCL 10 MG PO TABS: 10.0000 mg | ORAL_TABLET | Freq: Every day | ORAL | 4 refills | Status: DC

## 2022-11-02 MED ORDER — MEMANTINE HCL 10 MG PO TABS: 10.0000 mg | ORAL_TABLET | Freq: Two times a day (BID) | ORAL | 4 refills | Status: DC

## 2022-11-02 NOTE — Progress Notes (Signed)
PATIENT: Amanda Contreras DOB: [birth year]  Chief Complaint  Patient presents with   Follow-up    Rm 12. Accompanied by friend. Pt reports some decrease in memory.   HISTORICAL  Amanda Contreras is a 76 years old right-handed female, seen in refer by  primary care physician [name], MD in September 23 2015 for evaluation of memory loss, she is accompanied by her cousin [name] at today's clinical visit.Initial evaluation was in October 2016.   She has past medical history of sensory neuronopathy hearing loss since 2012, hyperlipidemia, previous smoker, quit in 1989,   She had master degree, majored in [place] education, was a retired Pharmacist, hospital at age 67, she was widowed since 2012, also took care of her mother afterwards, who passed away in Spring of 2015.   I saw her previously for double vision in 2013, which has resolved, I personally reviewed MRI of the brain January 2013, mild small vessel disease, generalized atrophy.   Since spring of 2015, she was noted to have word finding difficulties, sometimes driving familiar route, she has to think hard to get to the destination, once she was lost in her urologist office. Still managing her own bill, lives with her dog at her household, forgetting appointments sometimes, she also complains of difficulty falling to sleep, not eating regularly, she is also on diet, lost over 30 pounds since 2015.   Her paternal grandmother suffered dementia.   Update December 27 2015: She was started on Namenda 10 mg twice a day since October 2016, She tolerates it well. She still has mild memory loss, she lives by her self, she still drives, " have to think where I am going",  She has GPS, she has trouble keep things organized,  She eats well, mild insomnia, tends to sleep late.     She is seeing an elder care attorney, financial consultation, which has added on pressure to her.   UPDATE Sept 28th 2017: She is accompanied by her daughter and 2 cousins at  today's clinical visit   we have evaluate her neuropsychological evaluation by Dr. [name]," profile considered together with the description of her everyday functioning would be consistent with Mild Cognitive Impairment, primarily amnestic type. Her relatively intact attentional and executive functioning coupled with her insight into her memory difficulties would bode well for her potential to maintain independent living"   She continue complains of progressive worsening memory loss, her daughter has made some arrangements to clean out her house, potential transition into  friend's home assistant living in 4 years,    Her father suffered Parkinson's disease, memory loss, paternal grandmother has memory loss.   UPDATE March 29 2017: She is with her daughter, and 2 cousins [name] and [name] at today's clinical visit, she is noted to be more anxious, increased difficulty on daily activity, has to be reminded about taking her medications, also has difficulty staying alone at evening time, make phone calls to her cousins in the middle of the night   She is taking Namenda 10 mg twice a day, did not try Aricept worry about the side effects, [facility]   UPDATE Oct 01 2017: She came in with her legal guardian Mr. [name], her cousin [name], she was seen by [name], August 03 2017, confirm her diagnosis of mild , was also given the prescription of Remeron 7.5 mg every night for anxiety   She lives in her  house since 2012, with her dog, there was a prolonged discussion of potential move to assisted living at friend's home, but patient seems to be very resistant to the idea, she still driving short distance, but has very irregular eating habit, have friends and relatives check on her regularly.   UPDATE April 01 2018: She is here with her former husband, now friend and power of attorney Mr. [name], she continues to have slow worsening  memory loss, complains of naming difficulty, she lives independently, family struggled to persuade her to move to a supervised environment   Virtual Visit via Video  08/12/2019:   She is overall stable, continue to be physically active, tolerating Namenda 10 mg twice a day, no longer taking Remeron she exercise regularly  UPDATE Apr 20 2020: She is accompanied by her friend [name] at today's visit, she is overall doing well, missing her medication sometimes, she enjoys spending time with her dog,  She has more trouble finding direction, has difficulty using GPS, laboratory evaluation in October 2020, LDL 149, normal TSH 1.78, normal CMP, creatinine of 0.69, B12 of 480  I personally reviewed her last MRI of in 2013,  Update June 21, 2021 SS: Has moved to [facility] independent living for 5 weeks now, here today with friend [name]. Moved due to wanting more social interaction. Has word finding trouble, otherwise denies issues. Manages her finances, medications. Keeps her apartment. 1 time recently was driving, realized she forgot to look left, her family and friends are concerned about her driving. MMSE 26/30. Walks her dog twice daily.   Update Apr 24, 2022 SS: MMSE 19/30 today. Here with friend, Suszanne Conners (is a Marine scientist). Feel memory worsening overtime. Realized here memory is slipping, based on memory testing, medication review. Is prescribed Aricept, Namenda, realized has not been taking, not sure how long? Thinks giving them to her dog.  Does short distance driving to the store/bank.  No issues thus far.  Has a housekeeper, gets 1 meal a day from facility. Has option for med help.  Daughter lives out of state. No falls, trouble with word finding.  Update November 02, 2022 SS: Here today with her friend, Lorna Few. Still at independent living. Denies any major changes in memory. Some delay with word finding. Is no longer driving. Does have help with her medications from staff, who comes at night, has  not been taking the AM Namenda. No major changes noted. No health issues. Sleeping well, eating good.    REVIEW OF SYSTEMS: Full 14 system review of systems performed and notable only for as above  See HPI  ALLERGIES: Allergies  Allergen Reactions   Hydrocodone Other (See Comments)    dizziness   Latex Rash   Sulfa Antibiotics Rash    HOME MEDICATIONS: Current Outpatient Medications  Medication Sig Dispense Refill   donepezil (ARICEPT) 10 MG tablet Take 1 tablet (10 mg total) by mouth at bedtime. 90 tablet 4   memantine (NAMENDA) 10 MG tablet Take 1 tablet (10 mg total) by mouth 2 (two) times daily. 180 tablet 4   Multiple Vitamins-Minerals (PRESERVISION AREDS 2 PO) Take 1 tablet by mouth 2 (two) times daily.  (Patient not taking: Reported on 04/24/2022)     No current facility-administered medications for this visit.    PAST MEDICAL HISTORY: Past Medical History:  Diagnosis Date   Arthritis    Genital prolapse    Heart murmur    Hypercholesteremia     PAST SURGICAL HISTORY: Past Surgical History:  Procedure Laterality Date   ANTERIOR AND POSTERIOR REPAIR N/A 10/19/2015   Procedure: ANTERIOR (CYSTOCELE) ;  Surgeon: Bjorn Loser, MD;  Location: [facility];  Service: Urology;  Laterality: N/A;   CYSTOSCOPY N/A 10/19/2015   Procedure: CYSTOSCOPY;  Surgeon: Bjorn Loser, MD;  [facility]: [facility];  Service: Urology;  Laterality: N/A;   DILATATION & CURETTAGE/HYSTEROSCOPY WITH MYOSURE N/A 09/02/2015   Procedure: DILATATION & CURETTAGE/HYSTEROSCOPY WITH MYOSURE;  Surgeon: Servando Salina, MD;  Location: [facility];  Service: Gynecology;  Laterality: N/A;   PUBOVAGINAL SLING N/A 10/19/2015   Procedure: Inetta Fermo  ;  Surgeon: Bjorn Loser, MD;  [facility];  Service: Urology;  Laterality: N/A;   SALPINGOOPHORECTOMY Bilateral 10/19/2015   Procedure: BILATERAL SALPINGO OOPHORECTOMY;  Surgeon: Servando Salina, MD;  Location: [facility];  Service: Gynecology;  Laterality:  Bilateral;   Uterine/Bladder Prolapse Surgery     Scheduled 10/19/15   VAGINAL HYSTERECTOMY N/A 10/19/2015   Procedure: HYSTERECTOMY VAGINAL;  Surgeon: Servando Salina, MD;  Location: [facility];  Service: Gynecology;  Laterality: N/A;    FAMILY HISTORY: Family History  Problem Relation Age of Onset   Parkinson's disease Father    Pancreatitis Maternal Grandmother    Diabetes Maternal Grandfather    Heart attack Maternal Grandfather    Stomach cancer Paternal Grandfather    Dementia Paternal Grandmother     SOCIAL HISTORY: Social History   Socioeconomic History   Marital status: Widowed    Spouse name: Not on file   Number of children: 1   Years of education: Masters   Highest education level: Not on file  Occupational History   Occupation: Retired  Tobacco Use   Smoking status: Former    Types: Cigarettes    Quit date: 10/11/1988    Years since quitting: 34.0   Smokeless tobacco: Never   Tobacco comments:    Quit 1988  Substance and Sexual Activity   Alcohol use: Yes    Alcohol/week: 0.0 standard drinks of alcohol    Comment: Social   Drug use: No   Sexual activity: Not on file  Other Topics Concern   Not on file  Social History Narrative   Lives at home alone.   Right-handed.   2-4 cups per week.   Social Determinants of Health   Financial Resource Strain: Not on file  Food Insecurity: Not on file  Transportation Needs: Not on file  Physical Activity: Not on file  Stress: Not on file  Social Connections: Not on file  Intimate Partner Violence: Not on file   PHYSICAL EXAM   Vitals:   11/02/22 1100  BP: 117/74  Pulse: (!) 57  Weight: 127 lb (57.6 kg)  Height: '5\' 2"'$  (1.575 m)    Not recorded    Body mass index is 23.23 kg/m.     11/02/2022   11:19 AM 04/24/2022   10:44 AM 06/21/2021    1:43 PM  MMSE - Mini Mental State Exam  Orientation to time '3 3 4  '$ Orientation to Place '3 4 5  '$ Registration '3 3 3  '$ Attention/ Calculation '3 2 5  '$ Recall 1  0 0  Language- name 2 objects '2 1 2  '$ Language- repeat '1 1 1  '$ Language- follow 3 step command '3 3 3  '$ Language- read & follow direction '1 1 1  '$ Write a sentence '1 1 1  '$ Copy design 0 0 1  Copy design-comments   4-legged animals x 1 minute: 6  Total score 21 19 26  Physical Exam  General: The patient is alert and cooperative at the time of the examination.  Skin: No significant peripheral edema is noted.  Neurologic Exam  Mental status: The patient is alert, provides her own history, does look to her friend for reassurance with answers, mild word finding  Cranial nerves: Facial symmetry is present. Speech is normal, no aphasia or dysarthria is noted. Extraocular movements are full. Visual fields are full.  Motor: The patient has good strength in all 4 extremities.  Sensory examination: Soft touch sensation is symmetric on the face, arms, and legs.  Coordination: The patient has good finger-nose-finger and heel-to-shin bilaterally.  No apraxia.  Gait and station: The patient has a normal gait.   Reflexes: Deep tendon reflexes are symmetric.  DIAGNOSTIC DATA (LABS, IMAGING, TESTING) - I reviewed patient records, labs, notes, testing and imaging myself where available.  ASSESSMENT AND PLAN  PAULINE PEGUES is a 76 y.o. female    Dementia without behavioral disturbance  -MMSE 21/30 today (19/02 May 2022) -Not clear she is taking AM dose of Namenda, her friend will check on this we discussed switching to Namenda XR, will discuss with her daughter , for now, continue Namenda 10 mg twice a day, Aricept 10 mg daily -She is no longer driving -Encouraged her to continue her exercise, social activity at the independent living community -Follow-up in 6 months or sooner if needed  Butler Denmark, Laqueta Jean, DNP  [facility] [address] [place], [place] [address] [phone]

## 2023-05-29 ENCOUNTER — Ambulatory Visit: Payer: Medicare PPO | Admitting: Neurology

## 2023-05-29 ENCOUNTER — Encounter: Payer: Self-pay | Admitting: Neurology

## 2023-05-29 VITALS — BP 126/64 | HR 56 | Ht 63.0 in | Wt 131.0 lb

## 2023-05-29 DIAGNOSIS — G309 Alzheimer's disease, unspecified: Secondary | ICD-10-CM

## 2023-05-29 DIAGNOSIS — F02B0 Dementia in other diseases classified elsewhere, moderate, without behavioral disturbance, psychotic disturbance, mood disturbance, and anxiety: Secondary | ICD-10-CM | POA: Diagnosis not present

## 2023-05-29 MED ORDER — MEMANTINE HCL 10 MG PO TABS: 10.0000 mg | ORAL_TABLET | Freq: Two times a day (BID) | ORAL | 4 refills | Status: DC

## 2023-05-29 MED ORDER — DONEPEZIL HCL 10 MG PO TABS: 10.0000 mg | ORAL_TABLET | Freq: Every day | ORAL | 4 refills | Status: DC

## 2023-05-29 NOTE — Progress Notes (Signed)
PATIENT: Amanda Contreras DOB: [birth year]  Chief Complaint  Patient presents with   Follow-up    Rm 12, with friend cecilia, memory f/u. Feels memory is getting worse   HISTORICAL  Amanda Contreras is a 77 years old right-handed female, seen in refer by  primary care physician Dr. Juluis Rainier, MD in September 23 2015 for evaluation of memory loss, she is accompanied by her cousin Clydie Braun at today's clinical visit.Initial evaluation was in October 2016.   She has past medical history of sensory neuronopathy hearing loss since 2012, hyperlipidemia, previous smoker, quit in 1989,   She had master degree, majored in [place] education, was a retired Runner, broadcasting/film/video at age 67, she was widowed since 2012, also took care of her mother afterwards, who passed away in Spring of 2015.   I saw her previously for double vision in 2013, which has resolved, I personally reviewed MRI of the brain January 2013, mild small vessel disease, generalized atrophy.   Since spring of 2015, she was noted to have word finding difficulties, sometimes driving familiar route, she has to think hard to get to the destination, once she was lost in her urologist office. Still managing her own bill, lives with her dog at her household, forgetting appointments sometimes, she also complains of difficulty falling to sleep, not eating regularly, she is also on diet, lost over 30 pounds since 2015.   Her paternal grandmother suffered dementia.   Update December 27 2015: She was started on Namenda 10 mg twice a day since October 2016, She tolerates it well. She still has mild memory loss, she lives by her self, she still drives, " have to think where I am going",  She has GPS, she has trouble keep things organized,  She eats well, mild insomnia, tends to sleep late.     She is seeing an elder care attorney, financial consultation, which has added on pressure to her.   UPDATE Sept 28th 2017: She is accompanied by her daughter and 2 cousins  at today's clinical visit   we have evaluate her neuropsychological evaluation by Dr. Jacquelyne Balint," profile considered together with the description of her everyday functioning would be consistent with Mild Cognitive Impairment, primarily amnestic type. Her relatively intact attentional and executive functioning coupled with her insight into her memory difficulties would bode well for her potential to maintain independent living"   She continue complains of progressive worsening memory loss, her daughter has made some arrangements to clean out her house, potential transition into  friend's home assistant living in 4 years,    Her father suffered Parkinson's disease, memory loss, paternal grandmother has memory loss.   UPDATE March 29 2017: She is with her daughter, and 2 cousins Clydie Braun and [name] at today's clinical visit, she is noted to be more anxious, increased difficulty on daily activity, has to be reminded about taking her medications, also has difficulty staying alone at evening time, make phone calls to her cousins in the middle of the night   She is taking Namenda 10 mg twice a day, did not try Aricept worry about the side effects, [facility]   UPDATE Oct 01 2017: She came in with her legal guardian Mr. Meda Klinefelter, her cousin Clydie Braun, she was seen by Kateri Mc, August 03 2017, confirm her diagnosis of mild , was also given the prescription of Remeron 7.5 mg every night for anxiety   She lives in  her house since 2012, with her dog, there was a prolonged discussion of potential move to assisted living at friend's home, but patient seems to be very resistant to the idea, she still driving short distance, but has very irregular eating habit, have friends and relatives check on her regularly.   UPDATE April 01 2018: She is here with her former husband, now friend and power of attorney Mr. Meda Klinefelter, she continues to have slow worsening  memory loss, complains of naming difficulty, she lives independently, family struggled to persuade her to move to a supervised environment   Virtual Visit via Video  08/12/2019:   She is overall stable, continue to be physically active, tolerating Namenda 10 mg twice a day, no longer taking Remeron she exercise regularly  UPDATE Apr 20 2020: She is accompanied by her friend [name] at today's visit, she is overall doing well, missing her medication sometimes, she enjoys spending time with her dog,  She has more trouble finding direction, has difficulty using GPS, laboratory evaluation in October 2020, LDL 149, normal TSH 1.78, normal CMP, creatinine of 0.69, B12 of 480  I personally reviewed her last MRI of in 2013,  Update June 21, 2021 SS: Has moved to [facility] independent living for 5 weeks now, here today with friend Teryl Lucy. Moved due to wanting more social interaction. Has word finding trouble, otherwise denies issues. Manages her finances, medications. Keeps her apartment. 1 time recently was driving, realized she forgot to look left, her family and friends are concerned about her driving. MMSE 26/30. Walks her dog twice daily.   Update Apr 24, 2022 SS: MMSE 19/30 today. Here with friend, Darrel Reach (is a Engineer, civil (consulting)). Feel memory worsening overtime. Realized here memory is slipping, based on memory testing, medication review. Is prescribed Aricept, Namenda, realized has not been taking, not sure how long? Thinks giving them to her dog.  Does short distance driving to the store/bank.  No issues thus far.  Has a housekeeper, gets 1 meal a day from facility. Has option for med help.  Daughter lives out of state. No falls, trouble with word finding.  Update November 02, 2022 SS: Here today with her friend, Mare Loan. Still at independent living. Denies any major changes in memory. Some delay with word finding. Is no longer driving. Does have help with her medications from staff, who comes at night, has  not been taking the AM Namenda. No major changes noted. No health issues. Sleeping well, eating good.   Update May 29, 2023 SS: Here with friend, Mare Loan, [place] 19/30. Still living independent living at [facility]. No longer driving. Has noted some more trouble with finding the right word to recall. Staff manages medications. Gets 2 meals a day, will go to dining home. Walks her dog during breakfast. Has made friends. Does exercise group few days a week. Is taking Namenda twice daily now, Aricept at night. Sleeping well. Health has been good. No falls. Does her own ADLs, uses cell phone.    REVIEW OF SYSTEMS: Full 14 system review of systems performed and notable only for as above  See HPI  ALLERGIES: Allergies  Allergen Reactions   Hydrocodone Other (See Comments)    dizziness   Latex Rash   Sulfa Antibiotics Rash    HOME MEDICATIONS: Current Outpatient Medications  Medication Sig Dispense Refill   donepezil (ARICEPT) 10 MG tablet Take 1 tablet (10 mg total) by mouth at bedtime. 90 tablet 4   memantine (NAMENDA) 10 MG tablet Take  1 tablet (10 mg total) by mouth 2 (two) times daily. 180 tablet 4   Multiple Vitamins-Minerals (PRESERVISION AREDS 2 PO) Take 1 tablet by mouth 2 (two) times daily.  (Patient not taking: Reported on 04/24/2022)     No current facility-administered medications for this visit.    PAST MEDICAL HISTORY: Past Medical History:  Diagnosis Date   Arthritis    Genital prolapse    Heart murmur    Hypercholesteremia     PAST SURGICAL HISTORY: Past Surgical History:  Procedure Laterality Date   ANTERIOR AND POSTERIOR REPAIR N/A 10/19/2015   Procedure: ANTERIOR (CYSTOCELE) ;  Surgeon: Alfredo Martinez, MD;  [facility];  Service: Urology;  Laterality: N/A;   CYSTOSCOPY N/A 10/19/2015   Procedure: CYSTOSCOPY;  Surgeon: Alfredo Martinez, MD;  Location: [facility];  Service: Urology;  Laterality: N/A;   DILATATION & CURETTAGE/HYSTEROSCOPY WITH MYOSURE N/A  09/02/2015   Procedure: DILATATION & CURETTAGE/HYSTEROSCOPY WITH MYOSURE;  Surgeon: Maxie Better, MD;  Location: [facility];  Service: Gynecology;  Laterality: N/A;   PUBOVAGINAL SLING N/A 10/19/2015   Procedure: Eston Mould  ;  Surgeon: Alfredo Martinez, MD;  [facility]: [facility];  Service: Urology;  Laterality: N/A;   SALPINGOOPHORECTOMY Bilateral 10/19/2015   Procedure: BILATERAL SALPINGO OOPHORECTOMY;  Surgeon: Maxie Better, MD;  Location: [facility];  Service: Gynecology;  Laterality: Bilateral;   Uterine/Bladder Prolapse Surgery     Scheduled 10/19/15   VAGINAL HYSTERECTOMY N/A 10/19/2015   Procedure: HYSTERECTOMY VAGINAL;  Surgeon: Maxie Better, MD;  Location: [facility];  Service: Gynecology;  Laterality: N/A;    FAMILY HISTORY: Family History  Problem Relation Age of Onset   Parkinson's disease Father    Pancreatitis Maternal Grandmother    Diabetes Maternal Grandfather    Heart attack Maternal Grandfather    Stomach cancer Paternal Grandfather    Dementia Paternal Grandmother     SOCIAL HISTORY: Social History   Socioeconomic History   Marital status: Widowed    Spouse name: Not on file   Number of children: 1   Years of education: Masters   Highest education level: Not on file  Occupational History   Occupation: Retired  Tobacco Use   Smoking status: Former    Types: Cigarettes    Quit date: 10/11/1988    Years since quitting: 34.6   Smokeless tobacco: Never   Tobacco comments:    Quit 1988  Substance and Sexual Activity   Alcohol use: Yes    Alcohol/week: 0.0 standard drinks of alcohol    Comment: Social   Drug use: No   Sexual activity: Not on file  Other Topics Concern   Not on file  Social History Narrative   Lives at independent living   Right-handed.   2-4 cups per week.   Social Determinants of Health   Financial Resource Strain: Not on file  Food Insecurity: Not on file  Transportation Needs: Not on file  Physical Activity: Not on file   Stress: Not on file  Social Connections: Not on file  Intimate Partner Violence: Not on file   PHYSICAL EXAM   Vitals:   05/29/23 1421  BP: 126/64  Pulse: (!) 56  SpO2: 94%  Weight: 131 lb (59.4 kg)  Height: 5\' 3"  (1.6 m)   Not recorded    Body mass index is 23.21 kg/m.     05/29/2023    2:24 PM 11/02/2022   11:19 AM 04/24/2022   10:44 AM  MMSE - Mini Mental State Exam  Orientation to time 3 3 3   Orientation to Place 3 3 4   Registration 0 3 3  Attention/ Calculation 5 3 2   Recall 0 1 0  Language- name 2 objects 2 2 1   Language- repeat 1 1 1   Language- follow 3 step command 3 3 3   Language- read & follow direction 1 1 1   Write a sentence 1 1 1   Copy design 0 0 0  Total score 19 21 19    Physical Exam  General: The patient is alert and cooperative at the time of the examination.  Skin: No significant peripheral edema is noted.  Neurologic Exam  Mental status: The patient is alert, provides her own history, does look to her friend for reassurance with answers, mild word finding, answers fairly well today   Cranial nerves: Facial symmetry is present. Speech is normal, no aphasia or dysarthria is noted. Extraocular movements are full. Visual fields are full.  Motor: The patient has good strength in all 4 extremities.  Sensory examination: Soft touch sensation is symmetric on the face, arms, and legs.  Coordination: The patient has good finger-nose-finger and heel-to-shin bilaterally.  No apraxia.  Gait and station: The patient has a normal gait.   Reflexes: Deep tendon reflexes are symmetric.  DIAGNOSTIC DATA (LABS, IMAGING, TESTING) - I reviewed patient records, labs, notes, testing and imaging myself where available.  ASSESSMENT AND PLAN  Amanda Contreras is a 77 y.o. female    Dementia without behavioral disturbance  -Seems to be overall stable, MMSE 19/30 today (21/02 Nov 2022, 19/02 May 2022) -Continue Aricept 10 mg daily, Namenda 10 mg twice daily,  the facility brings her medications, she is remembering to take twice daily -Encouraged to continue exercise, brain stimulating activity -Follow-up in 1 year or sooner if needed  Margie Ege, Edrick Oh, DNP  [facility] [address], [address] [place], [place] [address] [phone]

## 2023-05-29 NOTE — Patient Instructions (Signed)
Continue the Aricept and Namenda  Recommend brain stimulating activity, exercise, healthy eating with fruits and vegetables Keep close follow up with primary care

## 2023-07-17 ENCOUNTER — Other Ambulatory Visit: Payer: Self-pay | Admitting: Neurology

## 2023-09-17 DIAGNOSIS — R488 Other symbolic dysfunctions: Secondary | ICD-10-CM | POA: Diagnosis not present

## 2023-10-03 DIAGNOSIS — E041 Nontoxic single thyroid nodule: Secondary | ICD-10-CM | POA: Diagnosis not present

## 2023-10-03 DIAGNOSIS — Z0000 Encounter for general adult medical examination without abnormal findings: Secondary | ICD-10-CM | POA: Diagnosis not present

## 2023-10-03 DIAGNOSIS — F02B0 Dementia in other diseases classified elsewhere, moderate, without behavioral disturbance, psychotic disturbance, mood disturbance, and anxiety: Secondary | ICD-10-CM | POA: Diagnosis not present

## 2023-10-03 DIAGNOSIS — E7800 Pure hypercholesterolemia, unspecified: Secondary | ICD-10-CM | POA: Diagnosis not present

## 2023-10-03 DIAGNOSIS — G309 Alzheimer's disease, unspecified: Secondary | ICD-10-CM | POA: Diagnosis not present

## 2023-10-03 DIAGNOSIS — M8580 Other specified disorders of bone density and structure, unspecified site: Secondary | ICD-10-CM | POA: Diagnosis not present

## 2023-10-03 DIAGNOSIS — M7989 Other specified soft tissue disorders: Secondary | ICD-10-CM | POA: Diagnosis not present

## 2023-10-03 DIAGNOSIS — R4189 Other symptoms and signs involving cognitive functions and awareness: Secondary | ICD-10-CM | POA: Diagnosis not present

## 2023-10-04 ENCOUNTER — Other Ambulatory Visit: Payer: Self-pay | Admitting: Family Medicine

## 2023-10-04 DIAGNOSIS — M7989 Other specified soft tissue disorders: Secondary | ICD-10-CM

## 2023-10-05 DIAGNOSIS — R488 Other symbolic dysfunctions: Secondary | ICD-10-CM | POA: Diagnosis not present

## 2023-10-09 DIAGNOSIS — R488 Other symbolic dysfunctions: Secondary | ICD-10-CM | POA: Diagnosis not present

## 2023-10-11 DIAGNOSIS — R488 Other symbolic dysfunctions: Secondary | ICD-10-CM | POA: Diagnosis not present

## 2023-10-12 DIAGNOSIS — R488 Other symbolic dysfunctions: Secondary | ICD-10-CM | POA: Diagnosis not present

## 2023-10-16 DIAGNOSIS — H53143 Visual discomfort, bilateral: Secondary | ICD-10-CM | POA: Diagnosis not present

## 2023-10-16 DIAGNOSIS — H3530 Unspecified macular degeneration: Secondary | ICD-10-CM | POA: Diagnosis not present

## 2023-10-16 DIAGNOSIS — H4010X1 Unspecified open-angle glaucoma, mild stage: Secondary | ICD-10-CM | POA: Diagnosis not present

## 2023-10-16 DIAGNOSIS — H5201 Hypermetropia, right eye: Secondary | ICD-10-CM | POA: Diagnosis not present

## 2023-10-16 DIAGNOSIS — H401131 Primary open-angle glaucoma, bilateral, mild stage: Secondary | ICD-10-CM | POA: Diagnosis not present

## 2023-10-16 DIAGNOSIS — H524 Presbyopia: Secondary | ICD-10-CM | POA: Diagnosis not present

## 2023-10-16 DIAGNOSIS — H353132 Nonexudative age-related macular degeneration, bilateral, intermediate dry stage: Secondary | ICD-10-CM | POA: Diagnosis not present

## 2023-10-16 DIAGNOSIS — H52223 Regular astigmatism, bilateral: Secondary | ICD-10-CM | POA: Diagnosis not present

## 2023-10-17 DIAGNOSIS — R488 Other symbolic dysfunctions: Secondary | ICD-10-CM | POA: Diagnosis not present

## 2023-10-18 DIAGNOSIS — R488 Other symbolic dysfunctions: Secondary | ICD-10-CM | POA: Diagnosis not present

## 2023-10-19 DIAGNOSIS — R488 Other symbolic dysfunctions: Secondary | ICD-10-CM | POA: Diagnosis not present

## 2023-10-22 DIAGNOSIS — R488 Other symbolic dysfunctions: Secondary | ICD-10-CM | POA: Diagnosis not present

## 2023-10-23 DIAGNOSIS — R488 Other symbolic dysfunctions: Secondary | ICD-10-CM | POA: Diagnosis not present

## 2023-10-25 DIAGNOSIS — R488 Other symbolic dysfunctions: Secondary | ICD-10-CM | POA: Diagnosis not present

## 2023-10-29 DIAGNOSIS — R488 Other symbolic dysfunctions: Secondary | ICD-10-CM | POA: Diagnosis not present

## 2023-10-31 DIAGNOSIS — R488 Other symbolic dysfunctions: Secondary | ICD-10-CM | POA: Diagnosis not present

## 2023-11-05 DIAGNOSIS — R488 Other symbolic dysfunctions: Secondary | ICD-10-CM | POA: Diagnosis not present

## 2023-11-06 DIAGNOSIS — R488 Other symbolic dysfunctions: Secondary | ICD-10-CM | POA: Diagnosis not present

## 2023-11-08 DIAGNOSIS — R488 Other symbolic dysfunctions: Secondary | ICD-10-CM | POA: Diagnosis not present

## 2023-11-14 DIAGNOSIS — R488 Other symbolic dysfunctions: Secondary | ICD-10-CM | POA: Diagnosis not present

## 2023-11-19 DIAGNOSIS — R488 Other symbolic dysfunctions: Secondary | ICD-10-CM | POA: Diagnosis not present

## 2023-11-21 DIAGNOSIS — R488 Other symbolic dysfunctions: Secondary | ICD-10-CM | POA: Diagnosis not present

## 2023-11-22 DIAGNOSIS — R488 Other symbolic dysfunctions: Secondary | ICD-10-CM | POA: Diagnosis not present

## 2023-12-03 DIAGNOSIS — R488 Other symbolic dysfunctions: Secondary | ICD-10-CM | POA: Diagnosis not present

## 2023-12-04 DIAGNOSIS — R488 Other symbolic dysfunctions: Secondary | ICD-10-CM | POA: Diagnosis not present

## 2024-05-22 ENCOUNTER — Ambulatory Visit: Admitting: Neurology

## 2024-05-22 ENCOUNTER — Encounter: Payer: Self-pay | Admitting: Neurology

## 2024-05-22 VITALS — BP 116/64 | Ht 63.0 in | Wt 124.0 lb

## 2024-05-22 DIAGNOSIS — G309 Alzheimer's disease, unspecified: Secondary | ICD-10-CM | POA: Diagnosis not present

## 2024-05-22 DIAGNOSIS — F02B0 Dementia in other diseases classified elsewhere, moderate, without behavioral disturbance, psychotic disturbance, mood disturbance, and anxiety: Secondary | ICD-10-CM | POA: Diagnosis not present

## 2024-05-22 MED ORDER — DONEPEZIL HCL 10 MG PO TABS: 10.0000 mg | ORAL_TABLET | Freq: Every day | ORAL | 4 refills | Status: AC

## 2024-05-22 MED ORDER — MEMANTINE HCL 10 MG PO TABS: 10.0000 mg | ORAL_TABLET | Freq: Two times a day (BID) | ORAL | 4 refills | Status: AC

## 2024-05-22 NOTE — Patient Instructions (Signed)
 We will continue current medications.  Please follow-up with optometrist.

## 2024-05-22 NOTE — Progress Notes (Signed)
 PATIENT: Amanda Contreras DOB: 1946/07/15  Chief Complaint  Patient presents with   Follow-up    Rm 16, with friend Amanda Contreras, unable to complete MMSE, decline in memory   HISTORICAL  Amanda Contreras is a 78 years old right-handed female, seen in refer by  primary care physician Dr. Clydia Dart, MD in September 23 2015 for evaluation of memory loss, she is accompanied by her cousin Amanda Contreras at today's clinical visit.Initial evaluation was in October 2016.   She has past medical history of sensory neuronopathy hearing loss since 2012, hyperlipidemia, previous smoker, quit in 1989,   She had master degree, majored in [place] education, was a retired Runner, broadcasting/film/video at age 71, she was widowed since 2012, also took care of her mother afterwards, who passed away in Spring of 2015.   I saw her previously for double vision in 2013, which has resolved, I personally reviewed MRI of the brain January 2013, mild small vessel disease, generalized atrophy.   Since spring of 2015, she was noted to have word finding difficulties, sometimes driving familiar route, she has to think hard to get to the destination, once she was lost in her urologist office. Still managing her own bill, lives with her dog at her household, forgetting appointments sometimes, she also complains of difficulty falling to sleep, not eating regularly, she is also on diet, lost over 30 pounds since 2015.   Her paternal grandmother suffered dementia.   Update December 27 2015: She was started on Namenda  10 mg twice a day since October 2016, She tolerates it well. She still has mild memory loss, she lives by her self, she still drives,  have to think where I am going,  She has GPS, she has trouble keep things organized,  She eats well, mild insomnia, tends to sleep late.     She is seeing an elder care attorney, financial consultation, which has added on pressure to her.   UPDATE Sept 28th 2017: She is accompanied by her daughter and 2  cousins at today's clinical visit   we have evaluate her neuropsychological evaluation by Dr. Manuel Sell, profile considered together with the description of her everyday functioning would be consistent with Mild Cognitive Impairment, primarily amnestic type. Her relatively intact attentional and executive functioning coupled with her insight into her memory difficulties would bode well for her potential to maintain independent living   She continue complains of progressive worsening memory loss, her daughter has made some arrangements to clean out her house, potential transition into  friend's home assistant living in 4 years,    Her father suffered Parkinson's disease, memory loss, paternal grandmother has memory loss.   UPDATE March 29 2017: She is with her daughter, and 2 cousins Amanda Contreras and Amanda Contreras at today's clinical visit, she is noted to be more anxious, increased difficulty on daily activity, has to be reminded about taking her medications, also has difficulty staying alone at evening time, make phone calls to her cousins in the middle of the night   She is taking Namenda  10 mg twice a day, did not try Aricept  worry about the side effects, she also [facility]   UPDATE Oct 01 2017: She came in with her legal guardian Mr. Ardia Becket, her cousin Amanda Contreras, she was seen by Howie Mackie, August 03 2017, confirm her diagnosis of mild , was also given the prescription of Remeron 7.5 mg every night for anxiety   She lives in  her house since 2012, with her dog, there was a prolonged discussion of potential move to assisted living at friend's home, but patient seems to be very resistant to the idea, she still driving short distance, but has very irregular eating habit, have friends and relatives check on her regularly.   UPDATE April 01 2018: She is here with her former husband, now friend and power of attorney Mr. Ardia Becket, she continues to have slow  worsening memory loss, complains of naming difficulty, she lives independently, family struggled to persuade her to move to a supervised environment   Virtual Visit via Video  08/12/2019:   She is overall stable, continue to be physically active, tolerating Namenda  10 mg twice a day, no longer taking Remeron she exercise regularly  UPDATE Apr 20 2020: She is accompanied by her friend [name] at today's visit, she is overall doing well, missing her medication sometimes, she enjoys spending time with her dog,  She has more trouble finding direction, has difficulty using GPS, laboratory evaluation in October 2020, LDL 149, normal TSH 1.78, normal CMP, creatinine of 0.69, B12 of 480  I personally reviewed her last MRI of in 2013,  Update June 21, 2021 SS: Has moved to [facility] independent living for 5 weeks now, here today with friend Amanda Contreras. Moved due to wanting more social interaction. Has word finding trouble, otherwise denies issues. Manages her finances, medications. Keeps her apartment. 1 time recently was driving, realized she forgot to look left, her family and friends are concerned about her driving. MMSE 26/30. Walks her dog twice daily.   Update Apr 24, 2022 SS: MMSE 19/30 today. Here with friend, Amanda Contreras (is a Engineer, civil (consulting)). Feel memory worsening overtime. Realized here memory is slipping, based on memory testing, medication review. Is prescribed Aricept , Namenda , realized has not been taking, not sure how long? Thinks giving them to her dog.  Does short distance driving to the store/bank.  No issues thus far.  Has a housekeeper, gets 1 meal a day from facility. Has option for med help.  Daughter lives out of state. No falls, trouble with word finding.  Update November 02, 2022 SS: Here today with her friend, Amanda Contreras. Still at independent living. Denies any major changes in memory. Some delay with word finding. Is no longer driving. Does have help with her medications from staff, who comes at  night, has not been taking the AM Namenda . No major changes noted. No health issues. Sleeping well, eating good.   Update May 29, 2023 SS: Here with friend, Amanda Contreras, [place] 19/30. Still living independent living at [facility]. No longer driving. Has noted some more trouble with finding the right word to recall. Staff manages medications. Gets 2 meals a day, will go to dining home. Walks her dog during breakfast. Has made friends. Does exercise group few days a week. Is taking Namenda  twice daily now, Aricept  at night. Sleeping well. Health has been good. No falls. Does her own ADLs, uses cell phone.   Update May 22, 2024 SS: Remains at independent living apartment, here with Amanda Contreras. Didn't remember seeing me. Knows she doesn't remember, but doesn't think her memory is that bad. Staff gives medications, walks for exercise, has housekeeper. They provide meals, goes to dining hall. Has lost about 7 lbs. Remains on Aricept  10 mg, Namenda  10 mg BID. Yesterday she was upset with her memory loss. Can't find wallet right now, staff reports not unusual for her to misplace her things. She couldn't complete MMSE. Going  to see eye doctor, some vision change in right eye with distance blurred vision in central vision.     REVIEW OF SYSTEMS: Full 14 system review of systems performed and notable only for as above  See HPI  ALLERGIES: Allergies  Allergen Reactions   Hydrocodone Other (See Comments)    dizziness   Latex Rash   Sulfa Antibiotics Rash    HOME MEDICATIONS: Current Outpatient Medications  Medication Sig Dispense Refill   donepezil  (ARICEPT ) 10 MG tablet TAKE 1 TABLET(10 MG) BY MOUTH AT BEDTIME 90 tablet 4   memantine  (NAMENDA ) 10 MG tablet Take 1 tablet (10 mg total) by mouth 2 (two) times daily. 180 tablet 4   Multiple Vitamins-Minerals (PRESERVISION AREDS 2 PO) Take 1 tablet by mouth 2 (two) times daily.      No current facility-administered medications for this visit.    PAST MEDICAL  HISTORY: Past Medical History:  Diagnosis Date   Arthritis    Genital prolapse    Heart murmur    Hypercholesteremia     PAST SURGICAL HISTORY: Past Surgical History:  Procedure Laterality Date   ANTERIOR AND POSTERIOR REPAIR N/A 10/19/2015   Procedure: ANTERIOR (CYSTOCELE) ;  Surgeon: Erman Hayward, MD;  Location: [facility];  Service: Urology;  Laterality: N/A;   CYSTOSCOPY N/A 10/19/2015   Procedure: CYSTOSCOPY;  Surgeon: Erman Hayward, MD;  Location: [facility];  Service: Urology;  Laterality: N/A;   DILATATION & CURETTAGE/HYSTEROSCOPY WITH MYOSURE N/A 09/02/2015   Procedure: DILATATION & CURETTAGE/HYSTEROSCOPY WITH MYOSURE;  Surgeon: Ivery Marking, MD;  Location: [facility];  Service: Gynecology;  Laterality: N/A;   PUBOVAGINAL SLING N/A 10/19/2015   Procedure: Laureen Pon  ;  Surgeon: Erman Hayward, MD;  Location: [facility];  Service: Urology;  Laterality: N/A;   SALPINGOOPHORECTOMY Bilateral 10/19/2015   Procedure: BILATERAL SALPINGO OOPHORECTOMY;  Surgeon: Ivery Marking, MD;  Location: [facility];  Service: Gynecology;  Laterality: Bilateral;   Uterine/Bladder Prolapse Surgery     Scheduled 10/19/15   VAGINAL HYSTERECTOMY N/A 10/19/2015   Procedure: HYSTERECTOMY VAGINAL;  Surgeon: Ivery Marking, MD;  Location: [facility];  Service: Gynecology;  Laterality: N/A;    FAMILY HISTORY: Family History  Problem Relation Age of Onset   Parkinson's disease Father    Pancreatitis Maternal Grandmother    Diabetes Maternal Grandfather    Heart attack Maternal Grandfather    Stomach cancer Paternal Grandfather    Dementia Paternal Grandmother     SOCIAL HISTORY: Social History   Socioeconomic History   Marital status: Widowed    Spouse name: Not on file   Number of children: 1   Years of education: Masters   Highest education level: Not on file  Occupational History   Occupation: Retired  Tobacco Use   Smoking status: Former    Current packs/day: 0.00    Types:  Cigarettes    Quit date: 10/11/1988    Years since quitting: 35.6   Smokeless tobacco: Never   Tobacco comments:    Quit 1988  Substance and Sexual Activity   Alcohol use: Yes    Alcohol/week: 0.0 standard drinks of alcohol    Comment: Social   Drug use: No   Sexual activity: Not on file  Other Topics Concern   Not on file  Social History Narrative   Lives at independent living   Right-handed.   2-4 cups per week.   Social Drivers of Corporate investment banker Strain: Not on file  Food Insecurity: Not on file  Transportation Needs: Not on file  Physical Activity: Not on file  Stress: Not on file  Social Connections: Not on file  Intimate Partner Violence: Not on file   PHYSICAL EXAM   Vitals:   05/22/24 1246  BP: 116/64  Weight: 124 lb (56.2 kg)  Height: 5' 3 (1.6 m)    Not recorded    Body mass index is 21.97 kg/m.     05/29/2023    2:24 PM 11/02/2022   11:19 AM 04/24/2022   10:44 AM  MMSE - Mini Mental State Exam  Orientation to time 3 3 3   Orientation to Place 3 3 4   Registration 0 3 3  Attention/ Calculation 5 3 2   Recall 0 1 0  Language- name 2 objects 2 2 1   Language- repeat 1 1 1   Language- follow 3 step command 3 3 3   Language- read & follow direction 1 1 1   Write a sentence 1 1 1   Copy design 0 0 0  Total score 19 21 19    Physical Exam  General: The patient is alert and cooperative at the time of the examination. More confused today, relies on her friend, she laughs and shrugs off questions I don't know seems frustrated she doesn't know the answer    Skin: No significant peripheral edema is noted.  Neurologic Exam  Mental status: The patient is alert, provides her own history, does look to her friend for reassurance with answers,   Cranial nerves: Facial symmetry is present. Speech is normal, no aphasia or dysarthria is noted. Extraocular movements are full. Visual fields are full.  Motor: The patient has good strength in all 4  extremities.  Sensory examination: Soft touch sensation is symmetric on the face, arms, and legs.  Coordination: The patient has good finger-nose-finger and heel-to-shin bilaterally.  No apraxia.  Gait and station: The patient has a normal gait.   Reflexes: Deep tendon reflexes are symmetric.  DIAGNOSTIC DATA (LABS, IMAGING, TESTING) - I reviewed patient records, labs, notes, testing and imaging myself where available.  ASSESSMENT AND PLAN  SINIA ANTOSH is a 78 y.o. female    Dementia without behavioral disturbance  - Worsening memory, seems to have had some decline in her memory over the last year, could not complete MMSE.  Seems little more confused, frazzled today.  Relies more on her friend for answers and reassurance. - Continue Aricept  10 mg daily, Namenda  10 mg twice a day - Currently living in independent living, discussed the need to plan for the future, options for increased supervision going forward.  The facility is providing medications, meals - Encouraged to continue exercise, brain stimulating activity  2.  Vision change -Reports with distance, blurring to central vision in the right eye, has appointment with optometrist tomorrow.  Is difficult for her to describe. Normal with near vision.   Amanda Ding, DNP  [facility] [address], [address] [place], [place] [address] [phone]

## 2024-05-29 ENCOUNTER — Ambulatory Visit: Payer: Medicare PPO | Admitting: Neurology

## 2024-10-22 ENCOUNTER — Telehealth: Payer: Self-pay | Admitting: Neurology

## 2024-10-22 NOTE — Telephone Encounter (Signed)
 Returned call to daughter who reports patients memory is getting worse, She is more disorganized, not bathing, refusing therapy. She does report that someone comes daily to make sure medications are being taken. They are looking at memory care units. Advised that she should have memory care staff. Physician meeting to determine meeting criteria and we can provide medical records if needed. Daughter appreciative of call back

## 2024-10-22 NOTE — Telephone Encounter (Signed)
 Pt called to request callback from Np to dicuss Pt care and treatment .  Pt daughter have a few question  (DPR-Debran)

## 2024-10-27 NOTE — Telephone Encounter (Signed)
 Pt's daughter has called for needed information from Medical Records, call connected

## 2024-11-22 ENCOUNTER — Emergency Department (HOSPITAL_COMMUNITY)

## 2024-11-22 ENCOUNTER — Emergency Department (HOSPITAL_COMMUNITY)
Admission: EM | Admit: 2024-11-22 | Discharge: 2024-11-22 | Disposition: A | Discharge status: Home / Self Care | Attending: Emergency Medicine | Admitting: Emergency Medicine

## 2024-11-22 ENCOUNTER — Other Ambulatory Visit: Payer: Self-pay

## 2024-11-22 DIAGNOSIS — Y92512 Supermarket, store or market as the place of occurrence of the external cause: Secondary | ICD-10-CM | POA: Insufficient documentation

## 2024-11-22 DIAGNOSIS — S060XAA Concussion with loss of consciousness status unknown, initial encounter: Secondary | ICD-10-CM | POA: Insufficient documentation

## 2024-11-22 DIAGNOSIS — S0990XA Unspecified injury of head, initial encounter: Secondary | ICD-10-CM | POA: Diagnosis present

## 2024-11-22 DIAGNOSIS — W1830XA Fall on same level, unspecified, initial encounter: Secondary | ICD-10-CM | POA: Insufficient documentation

## 2024-11-22 DIAGNOSIS — W19XXXA Unspecified fall, initial encounter: Secondary | ICD-10-CM

## 2024-11-22 DIAGNOSIS — S0081XA Abrasion of other part of head, initial encounter: Secondary | ICD-10-CM | POA: Insufficient documentation

## 2024-11-22 MED ORDER — BACITRACIN ZINC 500 UNIT/GM EX OINT
TOPICAL_OINTMENT | CUTANEOUS | Status: AC
  Administered 2024-11-22: 1 via TOPICAL

## 2024-11-22 NOTE — ED Notes (Signed)
 Daughter called back and said her dad, patient's ex-husband Charlie will be picking her up.

## 2024-11-22 NOTE — ED Triage Notes (Signed)
 PT BIB [facility] from [facility]. She and boyfriend went their to shop and boyfriend turned around as they were walking in to find her face planted on the sidewalk. Suspected LOC. Hx of dementia, A&O x3. Boyfriend states she is more repetitive than normal. Pt does not have hx of falls. Possible chipped tooth. Abrasions to left cheek. C-collar applied.   194/80 HR 76, CBG 117 96% RA

## 2024-11-22 NOTE — ED Provider Notes (Signed)
 " [place] EMERGENCY DEPARTMENT AT [facility] Provider Note   CSN: [id number] Arrival date & time: 11/22/24  8279     Patient presents with: No chief complaint on file.   KEM PARCHER is a 78 y.o. female.   78 yo F with hx of mild dementia who presented after a fall. Hx obtained via ems who reports that the patient was at the grocery store when he turned around and found Cayleigh on the ground. Has had some repetitive questioning since then. Not on blood thinners. Denies pain to me other than her face. Last Tdap was 2021. Per Ex husband Charlie it sounds like she was getting out of an uber and fell.        Prior to Admission medications  Medication Sig Start Date End Date Taking? Authorizing Provider  donepezil  (ARICEPT ) 10 MG tablet Take 1 tablet (10 mg total) by mouth at bedtime. 05/22/24   Gayland Lauraine PARAS, NP  memantine  (NAMENDA ) 10 MG tablet Take 1 tablet (10 mg total) by mouth 2 (two) times daily. 05/22/24   Gayland Lauraine PARAS, NP  Multiple Vitamins-Minerals (PRESERVISION AREDS 2 PO) Take 1 tablet by mouth 2 (two) times daily.     [provider]    Allergies: Hydrocodone, Latex, and Sulfa antibiotics    Review of Systems  Updated Vital Signs BP 138/84   Pulse (!) 55   Temp 98 F (36.7 C) (Oral)   Resp 19   SpO2 100%   Physical Exam Constitutional:      General: She is not in acute distress.    Appearance: Normal appearance. She is not ill-appearing.  HENT:     Head: Normocephalic.     Comments: Scattered abrasions to the left side of the face.  No deep lacerations.    Right Ear: External ear normal.     Left Ear: External ear normal.     Mouth/Throat:     Mouth: Mucous membranes are moist.     Pharynx: Oropharynx is clear.  Eyes:     Extraocular Movements: Extraocular movements intact.     Conjunctiva/sclera: Conjunctivae normal.     Pupils: Pupils are equal, round, and reactive to light.     Comments: Pupils 4 mm  Neck:     Comments:  C-collar in place Cardiovascular:     Rate and Rhythm: Normal rate and regular rhythm.     Pulses: Normal pulses.     Heart sounds: Normal heart sounds.  Pulmonary:     Effort: Pulmonary effort is normal. No respiratory distress.     Breath sounds: Normal breath sounds.  Abdominal:     General: Abdomen is flat. There is no distension.     Palpations: Abdomen is soft. There is no mass.     Tenderness: There is no abdominal tenderness. There is no guarding.  Musculoskeletal:        General: No deformity. Normal range of motion.     Comments: No tenderness to palpation of midline thoracic or lumbar spine.  No step-offs palpated.  No tenderness to palpation of chest wall.  No bruising noted.  No tenderness to palpation of bilateral clavicles.  No tenderness to palpation, bruising, or deformities noted of bilateral shoulders, elbows, wrists, hips, knees, or ankles.  Neurological:     General: No focal deficit present.     Mental Status: She is alert. Mental status is at baseline.     Cranial Nerves: No cranial nerve deficit.  Sensory: No sensory deficit.     Motor: No weakness.     (all labs ordered are listed, but only abnormal results are displayed) Labs Reviewed - No data to display  EKG: EKG Interpretation Date/Time:  Saturday November 22 2024 17:44:56 EST Ventricular Rate:  54 PR Interval:  143 QRS Duration:  89 QT Interval:  415 QTC Calculation: 394 R Axis:   65  Text Interpretation: Sinus rhythm Low voltage, precordial leads Confirmed by Yolande Charleston [phone]) on 11/22/2024 5:51:26 PM  Radiology: CT Maxillofacial Wo Contrast Result Date: 11/22/2024 EXAM: CT OF THE FACE WITHOUT CONTRAST 11/22/2024 06:39:00 PM TECHNIQUE: CT of the face was performed without the administration of intravenous contrast. Multiplanar reformatted images are provided for review. Automated exposure control, iterative reconstruction, and/or weight based adjustment of the mA/kV was utilized  to reduce the radiation dose to as low as reasonably achievable. COMPARISON: None available. CLINICAL HISTORY: Fall FINDINGS: FACIAL BONES: No acute facial fracture. No mandibular dislocation. No suspicious bone lesion. ORBITS: Globes are intact. No acute traumatic injury. No inflammatory change. SINUSES AND MASTOIDS: No acute abnormality. SOFT TISSUES: No acute abnormality. IMPRESSION: 1. No acute facial fracture. Electronically signed by: Norman Gatlin MD 11/22/2024 07:16 PM EST RP Workstation: [id number]   CT Cervical Spine Wo Contrast Result Date: 11/22/2024 EXAM: CT CERVICAL SPINE WITHOUT CONTRAST 11/22/2024 06:39:00 PM TECHNIQUE: CT of the cervical spine was performed without the administration of intravenous contrast. Multiplanar reformatted images are provided for review. Automated exposure control, iterative reconstruction, and/or weight based adjustment of the mA/kV was utilized to reduce the radiation dose to as low as reasonably achievable. COMPARISON: None available. CLINICAL HISTORY: fall FINDINGS: BONES AND ALIGNMENT: Anterolisthesis of C3 is presumed chronic. No acute fracture or traumatic malalignment. DEGENERATIVE CHANGES: Multilevel spondylosis, disc space height loss, and degenerative endplate changes greatest from C4-C7 where it is moderate to advanced. Mild multilevel facet arthropathy. No severe spinal canal narrowing. SOFT TISSUES: No prevertebral soft tissue swelling. IMPRESSION: 1. No acute cervical spine fracture or traumatic malalignment. Electronically signed by: Norman Gatlin MD 11/22/2024 07:14 PM EST RP Workstation: [id number]   CT Head Wo Contrast Result Date: 11/22/2024 EXAM: CT HEAD WITHOUT CONTRAST 11/22/2024 06:39:00 PM TECHNIQUE: CT of the head was performed without the administration of intravenous contrast. Automated exposure control, iterative reconstruction, and/or weight based adjustment of the mA/kV was utilized to reduce the radiation dose to as low as  reasonably achievable. COMPARISON: None available. CLINICAL HISTORY: fall FINDINGS: BRAIN AND VENTRICLES: No acute hemorrhage. No evidence of acute infarct. No hydrocephalus. No extra-axial collection. No mass effect or midline shift. Age-related atrophy. Mild periventricular chronic small vessel ischemia. ORBITS: Bilateral cataract resection. SINUSES: No acute abnormality. SOFT TISSUES AND SKULL: No acute soft tissue abnormality. No skull fracture. Atherosclerosis of skullbase vasculature. IMPRESSION: 1. No acute intracranial abnormality. Electronically signed by: Norman Gatlin MD 11/22/2024 07:12 PM EST RP Workstation: [id number]     Procedures   Medications Ordered in the ED  bacitracin  ointment (1 Application Topical Given 11/22/24 1915)                                    Medical Decision Making Amount and/or Complexity of Data Reviewed Labs: ordered. Radiology: ordered.  Risk OTC drugs.   VALJEAN RUPPEL is a 78 y.o. female with comorbidities that complicate the patient evaluation including mild dementia who presents to the emergency department after a fall  Initial  Ddx:  TBI, c-spine injury, mechanical fall, syncope   MDM:  Concerned about possible TBI given the patient's fall and age so will obtain a head CT at this time.  With their age we will also obtain a CT of the C-spine as well.  Appears that this was a result of mechanical fall and did not have any preceding symptoms that would suggest syncope.  No other signs of trauma at this time on physical exam.  No signs of loose or missing teeth.  Up-to-date on her tetanus shot.  With repetitive questioning may have had a concussion  Plan:  CT head CT C-spine CT max face  ED Summary/Re-evaluation:  No acute injuries were found on the patient's imaging.  Will have them follow-up with their primary doctor in several days regarding their symptoms.  Instructed to take Tylenol  for pain.  Apply Neosporin to her cuts.  And to avoid  any activities where she could hit her head again because she may have had a concussion.  Legal guardian Mr. Eda updated  This patient presents to the ED for concern of complaints listed in HPI, this involves an extensive number of treatment options, and is a complaint that carries with it a high risk of complications and morbidity. Disposition including potential need for admission considered.   Dispo: DC Home. Return precautions discussed including, but not limited to, those listed in the AVS. Allowed pt time to ask questions which were answered fully prior to dc.  Additional history obtained from ex-husband and legal guardian Mr. Eda Records reviewed Outpatient Clinic Notes I independently reviewed the following imaging with scope of interpretation limited to determining acute life threatening conditions related to emergency care: CT Head and agree with the radiologist interpretation with the following exceptions: none I personally reviewed and interpreted the pt's EKG: see above for interpretation  I have reviewed the patients home medications and made adjustments as needed Social Determinants of health:  Elderly   Final diagnoses:  Fall, initial encounter  Minor head injury, initial encounter  Concussion with unknown loss of consciousness status, initial encounter    ED Discharge Orders     None          Yolande Lamar BROCKS, MD 11/22/24 1958  "

## 2024-11-22 NOTE — ED Notes (Signed)
 Report given to [facility] at the front desk. She states they do not have nurses on the weekend. Advised the patient would be returning in an uber provided by her daughter. She stated they would assist her in getting into her room.   Spoke with the daughter over the phone. She advised she would be providing her mom an [place] home.

## 2024-11-22 NOTE — Discharge Instructions (Addendum)
 You were seen for your head injury in the emergency department. It is likely that you had a concussion.  At home, please use tylenol  for any headaches that you may have.  Use Neosporin on the cuts on your face  Return gradually to your hobbies over the next week. Take breaks if you are having headaches or trouble thinking clearly.   Follow-up with your primary doctor in 1 week regarding your visit.  DO NOT return to playing sports or activities where you could sustain additional head trauma until cleared to do so by a healthcare provider.   Return immediately to the emergency department if you experience any of the following: severe headache, persistent vomiting, or any other concerning symptoms.    Thank you for visiting our Emergency Department. It was a pleasure taking care of you today.

## 2024-12-12 ENCOUNTER — Telehealth: Payer: Self-pay | Admitting: Neurology

## 2024-12-12 NOTE — Telephone Encounter (Signed)
 [facility] [employer] [facility]) calling to verify patient's last office visit.

## 2025-05-28 ENCOUNTER — Ambulatory Visit: Admitting: Neurology
